# Patient Record
Sex: Male | Born: 1970 | Race: Asian | Hispanic: No | Marital: Single
Health system: Southern US, Community
[De-identification: ages and names within clinical notes are randomized; demographics above are authoritative.]

## PROBLEM LIST (undated history)

## (undated) DIAGNOSIS — K852 Alcohol induced acute pancreatitis without necrosis or infection: Secondary | ICD-10-CM

## (undated) DIAGNOSIS — Z720 Tobacco use: Secondary | ICD-10-CM

## (undated) DIAGNOSIS — H547 Unspecified visual loss: Secondary | ICD-10-CM

## (undated) HISTORY — DX: Alcohol induced acute pancreatitis without necrosis or infection: K85.20

## (undated) NOTE — Progress Notes (Signed)
 Formatting of this note is different from the original.  Subjective:    Patient ID: Cole Ward is a 46 y.o. male.  HPI The patient came in consult for evaluation of abnormal liver enzymes, abnormal CT scan of the abdomen and abdominal pain.  The interview was conducted with translator via phone.  The patient complains of pain in the epigastric area.  The pain is moderate in intensity and dull in character.  The pain appears to be triggered by meals.  He has been associated with some nausea and no vomiting.  He denies diarrhea or blood in the stool.  The patient has had a recent history of acute pancreatitis.  He feels however his pain has been subsiding and he is feeling better.  He was prescribed pantoprazole  which he is not taking.  He denies heartburn dysphagia or odynophagia.  He has had history of for ascites and abnormal CT scan suggestive for cirrhosis.  He has a recent imaging study that showed inflammatory changes in pancreas which appears to be improving without evidence for necrosis.  The patient is an alcoholic.  He stated he used to drink 1 L of either whiskey or brandy daily for longer than 10 years.  He presumably quit drinking alcohol 2 to 3 months ago.  He is a daily smoker.  He denies IV drug use.  He has had previous blood testing showing severe liver fibrosis by serology panel.  There is no documented history for upper endoscopy or colonoscopy.  He stated he is eating better lately.  He has not had any history for changes in mental status or jaundice as per his statement. Review of Systems  Constitutional: Negative for appetite change, chills, fatigue, fever and unexpected weight change.  HENT: Negative for dental problem, mouth sores, nosebleeds, sore throat, trouble swallowing and voice change.   Eyes: Negative for redness, itching and visual disturbance.  Respiratory: Negative for cough and shortness of breath.   Cardiovascular: Negative for chest pain, palpitations and leg swelling.   Gastrointestinal: Positive for abdominal pain. Negative for abdominal distention, blood in stool, constipation, diarrhea, nausea and rectal pain.  Endocrine: Negative for polydipsia and polyphagia.  Genitourinary: Negative for dysuria, flank pain, genital sores and hematuria.  Musculoskeletal: Negative for arthralgias, back pain, gait problem and joint swelling.  Skin: Negative for pallor and rash.  Allergic/Immunologic: Negative for environmental allergies, food allergies and immunocompromised state.  Neurological: Negative for seizures, syncope and headaches.  Hematological: Negative for adenopathy. Does not bruise/bleed easily.  Psychiatric/Behavioral: Negative for decreased concentration, dysphoric mood and sleep disturbance. The patient is not nervous/anxious.     Objective:   Vitals:   12/05/20 1516  BP: 104/75  BP Location: Left arm  Patient Position: Sitting  Cuff Size: Adult Regular  Pulse: 118  Resp: 16  Temp: 98.5 F (36.9 C)  TempSrc: Tympanic  Weight: 54.9 kg (121 lb)  Height: 5' 3 (1.6 m)   Body mass index is 21.43 kg/m. Body surface area is 1.56 meters squared.  The following Quality Measures are up to date for Lathyn Cockrell: Clinical Depression Screening was performed and patient is negative for depression. Future Fall Risk Screening: patient is NEGATIVE for future fall risk    Physical Exam Vitals reviewed.  Constitutional:      General: He is not in acute distress.    Appearance: He is well-developed and well-nourished. He is not ill-appearing, toxic-appearing or diaphoretic.  HENT:     Head: Normocephalic and atraumatic.  Mouth/Throat:     Mouth: Oropharynx is clear and moist.     Pharynx: No oropharyngeal exudate.     Comments: Poor oral hygiene Eyes:     General: No scleral icterus.       Right eye: No discharge.        Left eye: No discharge.     Conjunctiva/sclera: Conjunctivae normal.     Pupils: Pupils are equal, round, and reactive to  light.  Neck:     Thyroid: No thyromegaly.     Vascular: No JVD.     Trachea: No tracheal deviation.  Cardiovascular:     Rate and Rhythm: Normal rate and regular rhythm.     Heart sounds: Normal heart sounds. No murmur heard.   No friction rub. No gallop.  Pulmonary:     Effort: Pulmonary effort is normal. No respiratory distress.     Breath sounds: Normal breath sounds. No stridor. No wheezing or rales.  Abdominal:     General: Bowel sounds are normal. There is no distension.     Palpations: Abdomen is soft. There is no mass.     Tenderness: There is abdominal tenderness. There is no guarding or rebound.     Comments: No hernias.  Tender abdomen to palpation right upper quadrant area.  No ascites.  No superficial collateral vein or ecchymosis. No bruits   Musculoskeletal:        General: No tenderness, deformity or edema. Normal range of motion.     Cervical back: Neck supple.  Lymphadenopathy:     Cervical: No cervical adenopathy.     Right cervical: No superficial cervical adenopathy.    Left cervical: No superficial cervical adenopathy.     Upper Body:     Right upper body: No supraclavicular adenopathy.     Left upper body: No supraclavicular adenopathy.  Skin:    Coloration: Skin is not jaundiced.     Findings: No erythema or rash.     Comments: No palmar erythema or spider angiomas  Neurological:     General: No focal deficit present.     Mental Status: He is alert and oriented to person, place, and time.     Cranial Nerves: No cranial nerve deficit.     Motor: No abnormal muscle tone.     Coordination: Coordination normal.     Gait: Gait normal.     Deep Tendon Reflexes: Reflexes are normal and symmetric.     Comments: No tremors or asterixis  Psychiatric:        Mood and Affect: Mood and affect and mood normal.        Behavior: Behavior normal. Behavior is cooperative.        Judgment: Judgment normal.     Comments: No overt encephalopathy    Assessment/Plan:      Patient with history of alcoholism.  History of ascites and pancreatitis likely related to alcohol use disorder.  He has had epigastric pain and fullness postprandially.  He denies any significant diarrhea.  He has not had any major weight loss.  Imaging study failed to show any enlarging cystic lesion or worsening pancreatic inflammatory changes.  He has laboratory testing supporting diagnosis of a portal hypertension and severe liver fibrosis.  His physical exam failed to show stigmata associated with chronic liver disease.  He has had ascites however.  He appears not to have history for GI bleed or encephalopathy.  The patient abdominal pain possibly is related to his inflammatory process  in pancreas.  We must evaluate for inflammatory process in upper GI tract and eventually for any possible outlet obstruction.  We advised for a low in fat diet.  He was instructed to avoid alcohol products completely.  I advised and counseling in this regard.  I educated him in this regard and informed that bad outcome including death could be consequence of her reinitiating alcohol intake.  He must keep a high-protein type of diet and low-salt diet as well.  I will obtain additional blood testing to ruled out competing etiologies for elevated liver enzymes.  I advised and counseled for discontinuation of tobacco products.  We discussed performing upper endoscopy for further evaluation of his pain and to assess for possible esophageal varices.  He is agreeable with test.  He may benefit from colonoscopy for colorectal cancer screening testing in the near future.  All the information has been transmitted through translator and the patient appears to understand properly.  Further recommendation and instruction has been provided in written to the patient.  He is to continue his pantoprazole  daily and we sent prescription for it.The procedure, EGD was explained in detail to the patient. The limitation of the procedure in  establishing diagnosis was reviewed. The risk of EGD including  bleeding,perforation, aspiration pneumonia and those related to sedatives were discussed in detail. All the question regarding the procedure were answered. Problem List Items Addressed This Visit     Gastroenterology Problems   Alcoholic cirrhosis of liver (HCC)   Relevant Medications   pantoprazole  (PROTONIX ) 40 MG tablet   Other Relevant Orders   Alpha-1-antitrypsin   Mitochondrial antibodies, M2   Antinuclear AB, Hep-2 Substrate   Smooth Muscle AB w/Reflex Titer   Iron Panel (Iron,TIBC,%Sat)   Alpha Fetoprotein   Miscellaneous Lab Test:   EGD  Other Visit Diagnoses    Abnormal CT of the abdomen    -  Primary   Relevant Medications   pantoprazole  (PROTONIX ) 40 MG tablet   Other Relevant Orders   Alpha-1-antitrypsin   Mitochondrial antibodies, M2   Antinuclear AB, Hep-2 Substrate   Smooth Muscle AB w/Reflex Titer   Iron Panel (Iron,TIBC,%Sat)   Alpha Fetoprotein   Miscellaneous Lab Test:   EGD   Other ascites       Abdominal pain, generalized       Relevant Medications   pantoprazole  (PROTONIX ) 40 MG tablet   Other Relevant Orders   Alpha-1-antitrypsin   Mitochondrial antibodies, M2   Antinuclear AB, Hep-2 Substrate   Smooth Muscle AB w/Reflex Titer   Iron Panel (Iron,TIBC,%Sat)   Alpha Fetoprotein   Miscellaneous Lab Test:   EGD   Alcohol-induced chronic pancreatitis (HCC)       Relevant Medications   pantoprazole  (PROTONIX ) 40 MG tablet   Other Relevant Orders   Alpha-1-antitrypsin   Mitochondrial antibodies, M2   Antinuclear AB, Hep-2 Substrate   Smooth Muscle AB w/Reflex Titer   Iron Panel (Iron,TIBC,%Sat)   Alpha Fetoprotein   Miscellaneous Lab Test:   EGD      Steffan Chapman, MD Electronically signed by Chapman Steffan, MD at 12/05/2020  6:22 PM EDT

---

## 2014-07-05 ENCOUNTER — Emergency Department (HOSPITAL_COMMUNITY): Payer: Self-pay

## 2014-07-05 ENCOUNTER — Inpatient Hospital Stay (HOSPITAL_COMMUNITY)
Admission: EM | Admit: 2014-07-05 | Discharge: 2014-07-07 | DRG: 391 | Disposition: A | Discharge status: Home / Self Care | Payer: Self-pay | Attending: Internal Medicine | Admitting: Internal Medicine

## 2014-07-05 ENCOUNTER — Encounter (HOSPITAL_COMMUNITY): Payer: Self-pay | Admitting: *Deleted

## 2014-07-05 DIAGNOSIS — K759 Inflammatory liver disease, unspecified: Secondary | ICD-10-CM | POA: Diagnosis present

## 2014-07-05 DIAGNOSIS — K859 Acute pancreatitis without necrosis or infection, unspecified: Secondary | ICD-10-CM | POA: Diagnosis present

## 2014-07-05 DIAGNOSIS — R748 Abnormal levels of other serum enzymes: Secondary | ICD-10-CM

## 2014-07-05 DIAGNOSIS — F17210 Nicotine dependence, cigarettes, uncomplicated: Secondary | ICD-10-CM | POA: Diagnosis present

## 2014-07-05 DIAGNOSIS — R7401 Elevation of levels of liver transaminase levels: Secondary | ICD-10-CM | POA: Diagnosis present

## 2014-07-05 DIAGNOSIS — F1010 Alcohol abuse, uncomplicated: Secondary | ICD-10-CM | POA: Diagnosis present

## 2014-07-05 DIAGNOSIS — H547 Unspecified visual loss: Secondary | ICD-10-CM | POA: Diagnosis present

## 2014-07-05 DIAGNOSIS — R109 Unspecified abdominal pain: Secondary | ICD-10-CM | POA: Diagnosis present

## 2014-07-05 DIAGNOSIS — K219 Gastro-esophageal reflux disease without esophagitis: Secondary | ICD-10-CM | POA: Diagnosis present

## 2014-07-05 DIAGNOSIS — Z21 Asymptomatic human immunodeficiency virus [HIV] infection status: Secondary | ICD-10-CM | POA: Diagnosis present

## 2014-07-05 DIAGNOSIS — Z720 Tobacco use: Secondary | ICD-10-CM | POA: Diagnosis present

## 2014-07-05 DIAGNOSIS — K2920 Alcoholic gastritis without bleeding: Principal | ICD-10-CM | POA: Diagnosis present

## 2014-07-05 DIAGNOSIS — R740 Nonspecific elevation of levels of transaminase and lactic acid dehydrogenase [LDH]: Secondary | ICD-10-CM

## 2014-07-05 DIAGNOSIS — K922 Gastrointestinal hemorrhage, unspecified: Secondary | ICD-10-CM | POA: Diagnosis present

## 2014-07-05 HISTORY — DX: Unspecified visual loss: H54.7

## 2014-07-05 HISTORY — DX: Tobacco use: Z72.0

## 2014-07-05 LAB — CBC WITH DIFFERENTIAL/PLATELET
Basophils Absolute: 0.1 10*3/uL (ref 0.0–0.1)
Basophils Relative: 1 % (ref 0–1)
Eosinophils Absolute: 0.3 10*3/uL (ref 0.0–0.7)
Eosinophils Relative: 4 % (ref 0–5)
HCT: 38.4 % — ABNORMAL LOW (ref 39.0–52.0)
Hemoglobin: 13.4 g/dL (ref 13.0–17.0)
LYMPHS PCT: 34 % (ref 12–46)
Lymphs Abs: 2.3 10*3/uL (ref 0.7–4.0)
MCH: 30.7 pg (ref 26.0–34.0)
MCHC: 34.9 g/dL (ref 30.0–36.0)
MCV: 88.1 fL (ref 78.0–100.0)
Monocytes Absolute: 0.3 10*3/uL (ref 0.1–1.0)
Monocytes Relative: 4 % (ref 3–12)
NEUTROS ABS: 3.9 10*3/uL (ref 1.7–7.7)
NEUTROS PCT: 57 % (ref 43–77)
PLATELETS: 101 10*3/uL — AB (ref 150–400)
RBC: 4.36 MIL/uL (ref 4.22–5.81)
RDW: 14.8 % (ref 11.5–15.5)
WBC: 6.9 10*3/uL (ref 4.0–10.5)

## 2014-07-05 LAB — URINALYSIS, ROUTINE W REFLEX MICROSCOPIC
BILIRUBIN URINE: NEGATIVE
GLUCOSE, UA: NEGATIVE mg/dL
Hgb urine dipstick: NEGATIVE
Ketones, ur: NEGATIVE mg/dL
Leukocytes, UA: NEGATIVE
Nitrite: NEGATIVE
PH: 6 (ref 5.0–8.0)
Protein, ur: NEGATIVE mg/dL
SPECIFIC GRAVITY, URINE: 1.007 (ref 1.005–1.030)
Urobilinogen, UA: 1 mg/dL (ref 0.0–1.0)

## 2014-07-05 LAB — COMPREHENSIVE METABOLIC PANEL
ALBUMIN: 2.9 g/dL — AB (ref 3.5–5.0)
ALT: 81 U/L — ABNORMAL HIGH (ref 17–63)
ANION GAP: 11 (ref 5–15)
AST: 505 U/L — ABNORMAL HIGH (ref 15–41)
Alkaline Phosphatase: 246 U/L — ABNORMAL HIGH (ref 38–126)
BUN: 10 mg/dL (ref 6–20)
CO2: 24 mmol/L (ref 22–32)
CREATININE: 0.96 mg/dL (ref 0.61–1.24)
Calcium: 8.5 mg/dL — ABNORMAL LOW (ref 8.9–10.3)
Chloride: 95 mmol/L — ABNORMAL LOW (ref 101–111)
GFR calc Af Amer: >60 mL/min (ref >60)
GFR calc non Af Amer: >60 mL/min (ref >60)
Glucose, Bld: 150 mg/dL — ABNORMAL HIGH (ref 65–99)
POTASSIUM: 3.9 mmol/L (ref 3.5–5.1)
SODIUM: 130 mmol/L — AB (ref 135–145)
Total Bilirubin: 3.7 mg/dL — ABNORMAL HIGH (ref 0.3–1.2)
Total Protein: 6.3 g/dL — ABNORMAL LOW (ref 6.5–8.1)

## 2014-07-05 LAB — LIPASE, BLOOD: Lipase: 58 U/L — ABNORMAL HIGH (ref 22–51)

## 2014-07-05 LAB — I-STAT CG4 LACTIC ACID, ED: Lactic Acid, Venous: 1.13 mmol/L (ref 0.5–2.0)

## 2014-07-05 MED ORDER — SODIUM CHLORIDE 0.9 % IV BOLUS (SEPSIS)
1000.0000 mL | INTRAVENOUS | Status: AC
  Administered 2014-07-05: 1000 mL via INTRAVENOUS

## 2014-07-05 MED ORDER — MORPHINE SULFATE 4 MG/ML IJ SOLN
4.0000 mg | Freq: Once | INTRAMUSCULAR | Status: AC
  Administered 2014-07-05: 4 mg via INTRAVENOUS
  Filled 2014-07-05: qty 1

## 2014-07-05 MED ORDER — ONDANSETRON HCL 4 MG/2ML IJ SOLN
4.0000 mg | INTRAMUSCULAR | Status: AC
  Administered 2014-07-05: 4 mg via INTRAVENOUS
  Filled 2014-07-05: qty 2

## 2014-07-05 NOTE — ED Provider Notes (Signed)
CSN: [id number]     Arrival date & time 07/05/14  1802 History   First MD Initiated Contact with Patient 07/05/14 2119     Chief Complaint  Patient presents with  . Abdominal Pain   (Consider location/radiation/quality/duration/timing/severity/associated sxs/prior Treatment) The history is provided by the patient. A language interpreter was used.     Cole Ward is a 44 present with right upper quadrant abdominal pain. He states the pain began intermittently, 2 weeks ago, but became more constant over the last 2-3 days. He also states over the last 2-3 days he has vomited after every meal. He vomited 3-4 times daily over last 2-3 days. His last meal was this morning and he subsequently vomited. He reports feeling weak due to not eating. He currently rates his pain as a 4/10. He states after eating the pain goes up to a 10/10 until he vomits and that improves the pain.  He denies any significant medical history. He denies any surgeries. He denies any alcohol intake. He denies fevers, chills, diarrhea or bloody emesis.  Past Medical History  Diagnosis Date  . Vision decreased    History reviewed. No pertinent past surgical history. No family history on file. History  Substance Use Topics  . Smoking status: Current Every Day Smoker -- 0.50 packs/day    Types: Cigarettes  . Smokeless tobacco: Not on file  . Alcohol Use: 4.2 oz/week    7 Cans of beer per week    Review of Systems  Constitutional: Negative for fever and chills.  HENT: Negative for sore throat.   Eyes: Negative for visual disturbance.  Respiratory: Negative for cough and shortness of breath.   Cardiovascular: Negative for chest pain and leg swelling.  Gastrointestinal: Positive for nausea, vomiting and abdominal pain. Negative for diarrhea.  Genitourinary: Negative for dysuria.  Musculoskeletal: Negative for myalgias.  Skin: Negative for rash.  Neurological: Negative for weakness, numbness and headaches.       Allergies  Review of patient's allergies indicates no known allergies.  Home Medications   Prior to Admission medications   Not on File   BP 115/90 mmHg  Pulse 78  Temp(Src) 98.6 F (37 C) (Oral)  Resp 16  Wt 121 lb (54.885 kg)  SpO2 97% Physical Exam  Constitutional: He appears well-developed and well-nourished. No distress.  HENT:  Head: Normocephalic and atraumatic.  Mouth/Throat: Oropharynx is clear and moist. No oropharyngeal exudate.  Eyes: Scleral icterus is present.  Neck: Normal range of motion.  Cardiovascular: Normal rate, regular rhythm and intact distal pulses.   Pulmonary/Chest: Effort normal and breath sounds normal. No respiratory distress. He has no wheezes. He has no rales. He exhibits no tenderness.  Abdominal: Soft. He exhibits no distension and no mass. There is no hepatosplenomegaly, splenomegaly or hepatomegaly. There is tenderness in the right upper quadrant. There is positive Murphy's sign. There is no rigidity, no rebound, no guarding, no CVA tenderness and no tenderness at McBurney's point.  Musculoskeletal: He exhibits no tenderness.  Lymphadenopathy:    He has no cervical adenopathy.  Neurological: He is alert.  Skin: Skin is warm and dry. No rash noted. He is not diaphoretic.  Psychiatric: He has a normal mood and affect.  Nursing note and vitals reviewed.   ED Course  Procedures (including critical care time) Labs Review Labs Reviewed  CBC WITH DIFFERENTIAL/PLATELET - Abnormal; Notable for the following:    HCT 38.4 (*)    Platelets 101 (*)    All other  components within normal limits  COMPREHENSIVE METABOLIC PANEL - Abnormal; Notable for the following:    Sodium 130 (*)    Chloride 95 (*)    Glucose, Bld 150 (*)    Calcium 8.5 (*)    Total Protein 6.3 (*)    Albumin 2.9 (*)    AST 505 (*)    ALT 81 (*)    Alkaline Phosphatase 246 (*)    Total Bilirubin 3.7 (*)    All other components within normal limits  LIPASE, BLOOD -  Abnormal; Notable for the following:    Lipase 58 (*)    All other components within normal limits  URINALYSIS, ROUTINE W REFLEX MICROSCOPIC - Abnormal; Notable for the following:    Color, Urine AMBER (*)    All other components within normal limits  HEPATITIS PANEL, ACUTE  I-STAT CG4 LACTIC ACID, ED  I-STAT CG4 LACTIC ACID, ED    Imaging Review US Abdomen Limited  07/06/2014   CLINICAL DATA:  Right upper quadrant abdominal pain.  EXAM: US ABDOMEN LIMITED - RIGHT UPPER QUADRANT  COMPARISON:  None.  FINDINGS: Gallbladder:  Physiologically distended. No gallstones or wall thickening visualized. No sonographic Murphy sign noted. Probable Phrygian cap.  Common bile duct:  Diameter: 6.1 mm.  Liver:  No focal lesion identified. Diffusely increased and patchy parenchymal echogenicity. Normal directional flow in the main portal vein.  IMPRESSION: 1. Borderline prominence of the common bile duct of 6 mm. No gallbladder distention. Significance is uncertain, this may be normal for this patient. 2. Increased hepatic echogenicity most consistent with steatosis.   Electronically Signed   By: Jeb Levering M.D.   On: 07/06/2014 00:44     EKG Interpretation   Date/Time:  Thursday Jul 05 2014 18:22:33 EDT Ventricular Rate:  78 PR Interval:  152 QRS Duration: 84 QT Interval:  364 QTC Calculation: 414 R Axis:   60 Text Interpretation:  Normal sinus rhythm Normal ECG No significant change  since last tracing Confirmed by Maryan Rued  MD, Loree Fee ([id number]) on 07/05/2014  10:23:34 PM      MDM   Final diagnoses:  Elevated liver enzymes   44 yo RUQ abd pain and scleral icterus, positive Murphy's sign, consider biliary obstruction. NS bolus, pain and nausea meds, CBC, CMP, Lipase, RUQ [place]. Discussed case with Dr. Maryan Rued.  Labs reviewed: NA: 130, AST: 505, ALT: 81, Alk Phos: 246, Total Bili: 3.7, Lipase 58. Hepatitis panel and lactic acid ordered.   1:30 AM: [place] reviewed showing borderline prominence  of the CBD but no definitive stones or obstruction. Consulted GI who recommends medical admission to trend liver enzymes. Discussed results and plan with family.  2:15 AM: Consulted Dr. Blaine Hamper (hospitalist), regarding pt's case and GI recommendations to admit for serial liver enzymes. Pt admitted to med/surg bed. The patient appears reasonably stabilized for admission considering the current resources, flow, and capabilities available in the ED at this time, and I doubt any further screening and/or treatment in the ED prior to admission.   Filed Vitals:   07/05/14 2230 07/05/14 2245 07/05/14 2300 07/06/14 0045  BP: 120/83 114/79 124/80 116/83  Pulse: 73 70 69 66  Temp:      TempSrc:      Resp:      Weight:      SpO2: 100% 99% 97% 96%   Meds given in ED:  Medications  sodium chloride 0.9 % bolus 1,000 mL (0 mLs Intravenous Stopped 07/06/14 0054)  morphine 4 MG/ML injection  4 mg (4 mg Intravenous Given 07/05/14 2156)  ondansetron (ZOFRAN) injection 4 mg (4 mg Intravenous Given 07/05/14 2156)    New Prescriptions   No medications on file      Britt Bottom, NP 07/06/14 [place], MD 07/09/14 [phone]

## 2014-07-05 NOTE — ED Notes (Signed)
Pt c/o RUQ pain x 2 weeks.  Denies nausea, diarrhea or constipation.  Came in today b/c the pain is too great.

## 2014-07-06 ENCOUNTER — Encounter (HOSPITAL_COMMUNITY): Payer: Self-pay | Admitting: Internal Medicine

## 2014-07-06 ENCOUNTER — Encounter (HOSPITAL_COMMUNITY)
Admission: EM | Disposition: A | Discharge status: Home / Self Care | Payer: Self-pay | Source: Home / Self Care | Attending: Internal Medicine

## 2014-07-06 ENCOUNTER — Inpatient Hospital Stay (HOSPITAL_COMMUNITY): Payer: Self-pay

## 2014-07-06 DIAGNOSIS — R740 Nonspecific elevation of levels of transaminase and lactic acid dehydrogenase [LDH]: Secondary | ICD-10-CM

## 2014-07-06 DIAGNOSIS — F1010 Alcohol abuse, uncomplicated: Secondary | ICD-10-CM | POA: Diagnosis present

## 2014-07-06 DIAGNOSIS — R748 Abnormal levels of other serum enzymes: Secondary | ICD-10-CM

## 2014-07-06 DIAGNOSIS — R7401 Elevation of levels of liver transaminase levels: Secondary | ICD-10-CM | POA: Diagnosis present

## 2014-07-06 DIAGNOSIS — R109 Unspecified abdominal pain: Secondary | ICD-10-CM | POA: Diagnosis present

## 2014-07-06 DIAGNOSIS — K921 Melena: Secondary | ICD-10-CM

## 2014-07-06 DIAGNOSIS — R1011 Right upper quadrant pain: Secondary | ICD-10-CM

## 2014-07-06 DIAGNOSIS — K859 Acute pancreatitis without necrosis or infection, unspecified: Secondary | ICD-10-CM | POA: Diagnosis present

## 2014-07-06 DIAGNOSIS — K922 Gastrointestinal hemorrhage, unspecified: Secondary | ICD-10-CM | POA: Diagnosis present

## 2014-07-06 DIAGNOSIS — Z720 Tobacco use: Secondary | ICD-10-CM | POA: Diagnosis present

## 2014-07-06 DIAGNOSIS — R1013 Epigastric pain: Secondary | ICD-10-CM

## 2014-07-06 LAB — RAPID URINE DRUG SCREEN, HOSP PERFORMED
AMPHETAMINES: NOT DETECTED
BARBITURATES: NOT DETECTED
BENZODIAZEPINES: NOT DETECTED
Cocaine: NOT DETECTED
OPIATES: POSITIVE — AB
Tetrahydrocannabinol: NOT DETECTED

## 2014-07-06 LAB — HIV ANTIBODY (ROUTINE TESTING W REFLEX): HIV SCREEN 4TH GENERATION: NONREACTIVE

## 2014-07-06 LAB — COMPREHENSIVE METABOLIC PANEL
ALT: 64 U/L — AB (ref 17–63)
AST: 342 U/L — AB (ref 15–41)
Albumin: 2.4 g/dL — ABNORMAL LOW (ref 3.5–5.0)
Alkaline Phosphatase: 192 U/L — ABNORMAL HIGH (ref 38–126)
Anion gap: 8 (ref 5–15)
BUN: <5 mg/dL — ABNORMAL LOW (ref 6–20)
CO2: 26 mmol/L (ref 22–32)
Calcium: 8.4 mg/dL — ABNORMAL LOW (ref 8.9–10.3)
Chloride: 102 mmol/L (ref 101–111)
Creatinine, Ser: 0.78 mg/dL (ref 0.61–1.24)
GFR calc Af Amer: >60 mL/min (ref >60)
GFR calc non Af Amer: >60 mL/min (ref >60)
GLUCOSE: 97 mg/dL (ref 65–99)
Potassium: 3.4 mmol/L — ABNORMAL LOW (ref 3.5–5.1)
SODIUM: 136 mmol/L (ref 135–145)
TOTAL PROTEIN: 5.8 g/dL — AB (ref 6.5–8.1)
Total Bilirubin: 5.4 mg/dL — ABNORMAL HIGH (ref 0.3–1.2)

## 2014-07-06 LAB — GLUCOSE, CAPILLARY
Glucose-Capillary: 108 mg/dL — ABNORMAL HIGH (ref 65–99)
Glucose-Capillary: 107 mg/dL — ABNORMAL HIGH (ref 65–99)

## 2014-07-06 LAB — ETHANOL

## 2014-07-06 LAB — APTT: aPTT: 57 seconds — ABNORMAL HIGH (ref 24–37)

## 2014-07-06 LAB — I-STAT CG4 LACTIC ACID, ED: Lactic Acid, Venous: 0.59 mmol/L (ref 0.5–2.0)

## 2014-07-06 LAB — PROTIME-INR
INR: 1.23 (ref 0.00–1.49)
Prothrombin Time: 15.7 seconds — ABNORMAL HIGH (ref 11.6–15.2)

## 2014-07-06 SURGERY — EGD (ESOPHAGOGASTRODUODENOSCOPY)
Anesthesia: Moderate Sedation

## 2014-07-06 MED ORDER — LORAZEPAM 1 MG PO TABS: 1.0000 mg | ORAL_TABLET | Freq: Four times a day (QID) | ORAL | Status: DC | PRN

## 2014-07-06 MED ORDER — IOHEXOL 300 MG/ML  SOLN
25.0000 mL | INTRAMUSCULAR | Status: AC
  Administered 2014-07-06: 25 mL via ORAL

## 2014-07-06 MED ORDER — ADULT MULTIVITAMIN W/MINERALS CH
1.0000 | ORAL_TABLET | Freq: Every day | ORAL | Status: DC
  Administered 2014-07-06 – 2014-07-07 (×2): 1 via ORAL
  Filled 2014-07-06 (×2): qty 1

## 2014-07-06 MED ORDER — SODIUM CHLORIDE 0.9 % IV SOLN: INTRAVENOUS | Status: DC

## 2014-07-06 MED ORDER — HEPARIN SODIUM (PORCINE) 5000 UNIT/ML IJ SOLN
5000.0000 [IU] | Freq: Three times a day (TID) | INTRAMUSCULAR | Status: DC
Start: 2014-07-06 — End: 2014-07-06
  Administered 2014-07-06: 5000 [IU] via SUBCUTANEOUS
  Filled 2014-07-06: qty 1

## 2014-07-06 MED ORDER — PANTOPRAZOLE SODIUM 40 MG IV SOLR
40.0000 mg | Freq: Two times a day (BID) | INTRAVENOUS | Status: DC
  Administered 2014-07-06 – 2014-07-07 (×3): 40 mg via INTRAVENOUS
  Filled 2014-07-06 (×4): qty 40

## 2014-07-06 MED ORDER — ONDANSETRON HCL 4 MG/2ML IJ SOLN: 4.0000 mg | Freq: Three times a day (TID) | INTRAMUSCULAR | Status: DC | PRN

## 2014-07-06 MED ORDER — SODIUM CHLORIDE 0.9 % IV SOLN
INTRAVENOUS | Status: DC
  Administered 2014-07-07: via INTRAVENOUS

## 2014-07-06 MED ORDER — SODIUM CHLORIDE 0.9 % IV SOLN
INTRAVENOUS | Status: DC
  Administered 2014-07-06: 06:00:00 via INTRAVENOUS

## 2014-07-06 MED ORDER — ALUM & MAG HYDROXIDE-SIMETH 200-200-20 MG/5ML PO SUSP: 30.0000 mL | Freq: Four times a day (QID) | ORAL | Status: DC | PRN

## 2014-07-06 MED ORDER — FOLIC ACID 1 MG PO TABS
1.0000 mg | ORAL_TABLET | Freq: Every day | ORAL | Status: DC
  Administered 2014-07-06 – 2014-07-07 (×2): 1 mg via ORAL
  Filled 2014-07-06 (×2): qty 1

## 2014-07-06 MED ORDER — IOHEXOL 300 MG/ML  SOLN
80.0000 mL | Freq: Once | INTRAMUSCULAR | Status: AC | PRN
  Administered 2014-07-06: 100 mL via INTRAVENOUS

## 2014-07-06 MED ORDER — LORAZEPAM 2 MG/ML IJ SOLN: 1.0000 mg | Freq: Four times a day (QID) | INTRAMUSCULAR | Status: DC | PRN

## 2014-07-06 MED ORDER — NICOTINE 21 MG/24HR TD PT24
21.0000 mg | MEDICATED_PATCH | Freq: Every day | TRANSDERMAL | Status: DC
  Administered 2014-07-06 – 2014-07-07 (×2): 21 mg via TRANSDERMAL
  Filled 2014-07-06 (×2): qty 1

## 2014-07-06 MED ORDER — POTASSIUM CHLORIDE CRYS ER 20 MEQ PO TBCR
40.0000 meq | EXTENDED_RELEASE_TABLET | Freq: Once | ORAL | Status: AC
  Administered 2014-07-06: 40 meq via ORAL
  Filled 2014-07-06: qty 2

## 2014-07-06 MED ORDER — VITAMIN B-1 100 MG PO TABS
100.0000 mg | ORAL_TABLET | Freq: Every day | ORAL | Status: DC
  Administered 2014-07-07: 100 mg via ORAL
  Filled 2014-07-06 (×2): qty 1

## 2014-07-06 MED ORDER — THIAMINE HCL 100 MG/ML IJ SOLN
100.0000 mg | Freq: Every day | INTRAMUSCULAR | Status: DC
  Administered 2014-07-06: 100 mg via INTRAVENOUS
  Filled 2014-07-06 (×2): qty 1

## 2014-07-06 MED ORDER — MORPHINE SULFATE 4 MG/ML IJ SOLN
2.0000 mg | INTRAMUSCULAR | Status: DC | PRN
  Administered 2014-07-06: 2 mg via INTRAVENOUS
  Filled 2014-07-06: qty 1

## 2014-07-06 NOTE — Progress Notes (Signed)
[facility]                                                                              Patient Demographics  Cole Ward, is a 44 y.o. male, DOB - 05/14/1970, [id number]:[id number]  Admit date - 07/05/2014   Admitting Physician Lorretta HarpXilin Niu, MD  Outpatient Primary MD for the patient is No PCP Per Patient  LOS - 0   Chief Complaint  Patient presents with  . Abdominal Pain       Brief HPI  Patient is a 44 year old male with tobacco abuse, alcohol abuse, presented with abdominal pain, speaks Burmese. History was obtained via translator. Patient reported that he has been having abdominal pain in the past 2 weeks. It is intermittent, happens almost every day. It has been worsening in the past 2 or 3 days. It is located in the right upper quadrant, moderate, nonradiating. It is associated with nausea and vomiting. He vomits almost every times when eating food in the past 2 days. He does not have diarrhea. No symptoms of UTI. He has mild cough due to smoking, which has not changed. No chest pain. Patient reports that he smokes 4-5 cigarettes per day in the past 30 years. He drinks beers very little.  Currently patient denies fever, chills, running nose, ear pain, headaches, chest pain, SOB, constipation, dysuria, urgency, frequency, hematuria, skin rashes, joint pain or leg swelling. No unilateral weakness, numbness or tingling sensations. No vision change or hearing loss.  In ED, patient was found to have abnormal liver function with AST 505, ALP 81, total bilirubin 3.7, ALP 246. Abdominal ultrasound showed borderline prominence of the common bile duct of 6 mm. No gallbladder distention, has increased hepatic echogenicity most consistent with steatosis. WBC 6.9, lipase 58, negative urinalysis, lactate 1.13. temperature normal, no tachycardia, electrolytes okay. Patient is admitted to inpatient for further evaluation and treatment.    Assessment & Plan    Principal Problem:   Abdominal  pain, epigastric with GI bleed, melanotic stools for 3 weeks:  -  discussed with gastroenterology, plan on EGD today  - CT abdomen and pelvis pending  - Continue PPI, trend LFTs   Active Problems:  transaminitis with pancreatitis  - Continue to trend LFTs, hepatitis panel pending, ultrasound of the abdomen showed borderline CBD 6 mm, no gallbladder distention, steatosis  - Acute hepatitis panel pending, LFTs trending down however bilirubin up - GI following Continue nothing by mouth status with IV fluid hydration-      Tobacco abuse - Counseled on smoking cessation     Alcohol abuse - Patient drinks 3-5 beers daily for many years, placed on  CIWA protocol with ativan  Code Status: full code  Family Communication: Discussed in detail with the patient, all imaging results, lab results explained to the patient    Disposition Plan:  pending endoscopy and GI evaluation   Time Spent in minutes 25 minutes  Procedures  US abd  Consults   GI  DVT Prophylaxis  SCD's. Heparin subcutaneous discontinued  Medications  Scheduled Meds: . folic acid  1 mg Oral Daily  . iohexol  25 mL Oral  Q1 Hr x 2  . multivitamin with minerals  1 tablet Oral Daily  . nicotine  21 mg Transdermal Daily  . pantoprazole (PROTONIX) IV  40 mg Intravenous Q12H  . potassium chloride  40 mEq Oral Once  . thiamine  100 mg Oral Daily   Or  . thiamine  100 mg Intravenous Daily   Continuous Infusions: . sodium chloride 125 mL/hr at 07/06/14 0533  . sodium chloride     PRN Meds:.alum & mag hydroxide-simeth, LORazepam **OR** LORazepam, morphine, ondansetron (ZOFRAN) IV   Antibiotics   Anti-infectives    None        Subjective:   Danis Brodrick was seen and examined today.   still has epigastric abdominal pain, no ongoing GI bleeding. No nausea or vomiting Patient denies dizziness, chest pain, shortness of breath, N/V/D/C, new weakness, numbess, tingling. No acute events overnight.    Objective:    Blood pressure 132/81, pulse 66, temperature 98.3 F (36.8 C), temperature source Oral, resp. rate 16, weight 54.885 kg (121 lb), SpO2 100 %.  Wt Readings from Last 3 Encounters:  07/05/14 54.885 kg (121 lb)     Intake/Output Summary (Last 24 hours) at 07/06/14 1255 Last data filed at 07/06/14 0938  Gross per 24 hour  Intake      0 ml  Output    400 ml  Net   -400 ml    Exam  General: Alert and oriented x 3, NAD  HEENT:  PERRLA, EOMI, mucous membranes moist.   Neck: Supple, no JVD, no masses  CVS: S1 S2 auscultated, no rubs, murmurs or gallops. Regular rate and rhythm.  Respiratory: Clear to auscultation bilaterally, no wheezing, rales or rhonchi  Abdomen: Soft, mild tenderness in the epigastric region and right upper quadrant , nondistended, + bowel sounds  Ext: no cyanosis clubbing or edema  Neuro: AAOx3, Cr N's II- XII. Strength 5/5 upper and lower extremities bilaterally  Skin: No rashes  Psych: Normal affect and demeanor, alert and oriented x3    Data Review   Micro Results No results found for this or any previous visit (from the past 240 hour(s)).  Radiology Reports [place] Abdomen Limited  07/06/2014   CLINICAL DATA:  Right upper quadrant abdominal pain.  EXAM: US ABDOMEN LIMITED - RIGHT UPPER QUADRANT  COMPARISON:  None.  FINDINGS: Gallbladder:  Physiologically distended. No gallstones or wall thickening visualized. No sonographic Murphy sign noted. Probable Phrygian cap.  Common bile duct:  Diameter: 6.1 mm.  Liver:  No focal lesion identified. Diffusely increased and patchy parenchymal echogenicity. Normal directional flow in the main portal vein.  IMPRESSION: 1. Borderline prominence of the common bile duct of 6 mm. No gallbladder distention. Significance is uncertain, this may be normal for this patient. 2. Increased hepatic echogenicity most consistent with steatosis.   Electronically Signed   By: Rubye OaksMelanie  Ehinger M.D.   On: 07/06/2014 00:44     CBC  Recent Labs Lab 07/05/14 1823  WBC 6.9  HGB 13.4  HCT 38.4*  PLT 101*  MCV 88.1  MCH 30.7  MCHC 34.9  RDW 14.8  LYMPHSABS 2.3  MONOABS 0.3  EOSABS 0.3  BASOSABS 0.1    Chemistries   Recent Labs Lab 07/05/14 1823 07/06/14 0630  NA 130* 136  K 3.9 3.4*  CL 95* 102  CO2 24 26  GLUCOSE 150* 97  BUN 10 <5*  CREATININE 0.96 0.78  CALCIUM 8.5* 8.4*  AST 505* 342*  ALT 81* 64*  ALKPHOS 246* 192*  BILITOT 3.7* 5.4*   ------------------------------------------------------------------------------------------------------------------ CrCl cannot be calculated (Unknown ideal weight.). ------------------------------------------------------------------------------------------------------------------ No results for input(s): HGBA1C in the last 72 hours. ------------------------------------------------------------------------------------------------------------------ No results for input(s): CHOL, HDL, LDLCALC, TRIG, CHOLHDL, LDLDIRECT in the last 72 hours. ------------------------------------------------------------------------------------------------------------------ No results for input(s): TSH, T4TOTAL, T3FREE, THYROIDAB in the last 72 hours.  Invalid input(s): FREET3 ------------------------------------------------------------------------------------------------------------------ No results for input(s): VITAMINB12, FOLATE, FERRITIN, TIBC, IRON, RETICCTPCT in the last 72 hours.  Coagulation profile  Recent Labs Lab 07/06/14 0630  INR 1.23    No results for input(s): DDIMER in the last 72 hours.  Cardiac Enzymes No results for input(s): CKMB, TROPONINI, MYOGLOBIN in the last 168 hours.  Invalid input(s): CK ------------------------------------------------------------------------------------------------------------------ Invalid input(s): POCBNP   Recent Labs  07/06/14 0548 07/06/14 0750  GLUCAP 108* 107*     Algie Westry M.D. [facility] 07/06/2014, 12:55 PM  Pager: [phone]   Between 7am to 7pm - call Pager - [phone]  After 7pm go to [email or web address] - password [email or web address]  Call night coverage person covering after 7pm

## 2014-07-06 NOTE — Progress Notes (Signed)
Utilized [employer] to talk with the patient during his assessment and to reinforce his NPO status at midnight for his EGD tomorrow.  The patient verbally stated that he agreed.

## 2014-07-06 NOTE — Progress Notes (Signed)
Received call from pts nurse. pts family brought him in a bowl of rice. EGD cancelled. Pt can eat AFTER CT scan. Reviewed with Dr Perry--pt will be put on for EGD tomorrow. 30-35 minutes spent on phone with pt and [employer] to again explain possible causes for abnormal LFTs, and possible causes of melena/heme positive stools. EGD explained to pt. Pt and his family have agreed  that pt will stay and have EGD. Pt also reported that he has had trouble with his stomach from alcohol years ago and had stopped drinking for a year, but then restarted drinking.   GI ATTENDING  As above  Wilhemina BonitoJohn N. Eda KeysPerry, Jr., M.D. [facility] Division of Gastroenterology

## 2014-07-06 NOTE — Progress Notes (Signed)
Utilization review completed.  

## 2014-07-06 NOTE — Progress Notes (Signed)
Using interpreter [phone] tried explaining to patient that if he would not eat until 4pm we could get the endo procedure done today. Patient still does not want to go through with it. According to interpreter he doesn't understand why and is afraid. Notified lori again. She is coming to talk with him.

## 2014-07-06 NOTE — ED Notes (Signed)
Family at bedside. 

## 2014-07-06 NOTE — Care Management Note (Signed)
Case Management Note  Patient Details  Name: Webb Lawsai Glassburn MRN: [id number] Date of Birth: 09/29/1970  Subjective/Objective:            NCM spoke with patient thru Burmese interpreter on the phone, patient does not have a pcp , [facility].  Also states if medications are not expensive he could afford them, but if they are expensive willl need med ast.  If patient is dc over the weekend the NCM will have to do the Match Program for patient if he can not afford meds, otherwise he could go to clinic Mon-Friday to get meds from their pharmacy.  NCM gave patient's son the  Brochure for the [facility] apt information.         Action/Plan:   Expected Discharge Date:                  Expected Discharge Plan:  Home/Self Care  In-House Referral:     Discharge planning Services  CM Consult, Indigent [facility] Clinic, Follow-up appt scheduled, Medication Assistance  Post Acute Care Choice:    Choice offered to:     DME Arranged:    DME Agency:     HH Arranged:    HH Agency:     Status of Service:  In process, will continue to follow  Medicare Important Message Given:  No Date Medicare IM Given:    Medicare IM give by:    Date Additional Medicare IM Given:    Additional Medicare Important Message give by:     If discussed at Long Length of Stay Meetings, dates discussed:    Additional Comments:  Leone Havenaylor, Mairead Schwarzkopf [place], RN 07/06/2014, 11:23 AM

## 2014-07-06 NOTE — H&P (Signed)
[facility] History and Physical  Cole LawsNai Larivee [id number] DOB: 12/29/1970 DOA: 07/05/2014  Referring physician: ED physician PCP: No PCP Per Patient  Specialists:   Chief Complaint: Abdominal pain, nausea, vomiting  HPI: Cole Ward is a 44 y.o. male with PMH of tobacco abuse, who presents with abdominal pain.  Patient does not speak [place]. He speaks Burmese. History was obtained via translator. Patient reports that he has been having abdominal pain in the past 2 weeks. It is intermittent, happens almost every day. It has been worsening in the past 2 or 3 days. It is located in the right upper quadrant, moderate, nonradiating. It is associated with nausea and vomiting. He vomits almost every times when eating food in the past 2 days. He does not have diarrhea. No symptoms of UTI. He has mild cough due to smoking, which has not changed. No chest pain. Patient reports that he smokes 4-5 cigarettes per day in the past 30 years. He drinks beers very little.  Currently patient denies fever, chills, running nose, ear pain, headaches, chest pain, SOB, constipation, dysuria, urgency, frequency, hematuria, skin rashes, joint pain or leg swelling. No unilateral weakness, numbness or tingling sensations. No vision change or hearing loss.  In ED, patient was found to have abnormal liver function with AST 505, ALP 81, total bilirubin 3.7, ALP 246. Abdominal ultrasound showed borderline prominence of the common bile duct of 6 mm. No gallbladder distention, has increased hepatic echogenicity most consistent with steatosis. WBC 6.9, lipase 58, negative urinalysis, lactate 1.13. temperature normal, no tachycardia, electrolytes okay. Patient is admitted to inpatient for further evaluation and treatment.  Where does patient live?   At home   Can patient participate in ADLs?  Yes     Review of Systems:   General: no fevers, chills, no changes in body weight, has poor appetite, has fatigue HEENT: no blurry  vision, hearing changes or sore throat Pulm: no dyspnea, coughing, wheezing CV: no chest pain, palpitations Abd: has nausea, vomiting, abdominal pain, No diarrhea, constipation GU: no dysuria, burning on urination, increased urinary frequency, hematuria  Ext: no leg edema Neuro: no unilateral weakness, numbness, or tingling, no vision change or hearing loss Skin: no rash MSK: No muscle spasm, no deformity, no limitation of range of movement in spin Heme: No easy bruising.  Travel history: No recent long distant travel.  Allergy: No Known Allergies  Past Medical History  Diagnosis Date  . Vision decreased   . Tobacco abuse     History reviewed. No pertinent past surgical history.  Social History:  reports that he has been smoking Cigarettes.  He has been smoking about 0.50 packs per day. He does not have any smokeless tobacco history on file. He reports that he drinks about 4.2 oz of alcohol per week. His drug history is not on file.  Family History: No family history on file.  patient reports that all family members are healthy.   Prior to Admission medications   Not on File    Physical Exam: Filed Vitals:   07/05/14 2300 07/06/14 0045 07/06/14 0300 07/06/14 0351  BP: 124/80 116/83 131/92 132/81  Pulse: 69 66 69 66  Temp:    98.3 F (36.8 C)  TempSrc:    Oral  Resp:    16  Weight:      SpO2: 97% 96% 98% 100%   General: Not in acute distress HEENT:       Eyes: PERRL, EOMI, no scleral icterus.  ENT: No discharge from the ears and nose, no pharynx injection, no tonsillar enlargement.        Neck: No JVD, no bruit, no mass felt. Heme: No neck lymph node enlargement. Cardiac: S1/S2, RRR, No murmurs, No gallops or rubs. Pulm: Good air movement bilaterally. No rales, wheezing, rhonchi or rubs. Abd: Soft, nondistended, tenderness over RUQ, no rebound pain, no organomegaly, BS present. Ext: No edema bilaterally. 2+DP/PT pulse bilaterally. Musculoskeletal: No joint  deformities, No joint redness or warmth, no limitation of ROM in spin. Skin: No rashes.  Neuro: Alert, oriented X3, cranial nerves II-XII grossly intact, muscle strength 5/5 in all extremities, sensation to light touch intact.  Psych: Patient is not psychotic, no suicidal or hemocidal ideation.  Labs on Admission:  Basic Metabolic Panel:  Recent Labs Lab 07/05/14 1823  NA 130*  K 3.9  CL 95*  CO2 24  GLUCOSE 150*  BUN 10  CREATININE 0.96  CALCIUM 8.5*   Liver Function Tests:  Recent Labs Lab 07/05/14 1823  AST 505*  ALT 81*  ALKPHOS 246*  BILITOT 3.7*  PROT 6.3*  ALBUMIN 2.9*    Recent Labs Lab 07/05/14 1823  LIPASE 58*   No results for input(s): AMMONIA in the last 168 hours. CBC:  Recent Labs Lab 07/05/14 1823  WBC 6.9  NEUTROABS 3.9  HGB 13.4  HCT 38.4*  MCV 88.1  PLT 101*   Cardiac Enzymes: No results for input(s): CKTOTAL, CKMB, CKMBINDEX, TROPONINI in the last 168 hours.  BNP (last 3 results) No results for input(s): BNP in the last 8760 hours.  ProBNP (last 3 results) No results for input(s): PROBNP in the last 8760 hours.  CBG:  Recent Labs Lab 07/06/14 0548 07/06/14 0750  GLUCAP 108* 107*    Radiological Exams on Admission: [place] Abdomen Limited  07/06/2014   CLINICAL DATA:  Right upper quadrant abdominal pain.  EXAM: US ABDOMEN LIMITED - RIGHT UPPER QUADRANT  COMPARISON:  None.  FINDINGS: Gallbladder:  Physiologically distended. No gallstones or wall thickening visualized. No sonographic Murphy sign noted. Probable Phrygian cap.  Common bile duct:  Diameter: 6.1 mm.  Liver:  No focal lesion identified. Diffusely increased and patchy parenchymal echogenicity. Normal directional flow in the main portal vein.  IMPRESSION: 1. Borderline prominence of the common bile duct of 6 mm. No gallbladder distention. Significance is uncertain, this may be normal for this patient. 2. Increased hepatic echogenicity most consistent with steatosis.    Electronically Signed   By: Rubye OaksMelanie  Ehinger M.D.   On: 07/06/2014 00:44    EKG: Independently reviewed. Normal EKG  Assessment/Plan Principal Problem:   Abdominal pain Active Problems:   Tobacco abuse   Elevated liver enzymes  Abdominal pain: Etiology is not clear. Lipase is negative. Urinalysis is negative. Patient has abnormal liver function. Ultrasound showed borderline prominence of the common bile duct of 6 mm. No gallbladder distention, has increased hepatic echogenicity most consistent with steatosis. No fever, chills, leukocytosis, less likely to have cholecystitis. ED discussed with on-call gastroenterologist, who suggested to trend liver enzymes.  -will admit to med-surg bed -check hepatitis panel -HIV, UDS  -pain control with morphine prn -Zofran for nausea prn -IVF -repeat CMP  Tobacco abuse and Alcohol abuse: -Did counseling about importance of quitting smoking -Nicotine patch   DVT ppx: SQ Heparin   Code Status: Full code Family Communication: None at bed side.   Disposition Plan: Admit to inpatient   Date of Service 07/06/2014    Lorretta HarpIU, Mykira Hofmeister  [facility] Pager [phone]  If 7PM-7AM, please contact night-coverage [email or web address] Password [email or web address] 07/06/2014, 8:40 AM

## 2014-07-06 NOTE — Progress Notes (Signed)
Walked in room patient was eating food from home. Was npo for endo today. Notified lori PA. Used interpreter service to explain to patient they could not eat. Used interpreter number [id number]. Patient said he did not want procedure. Called lori back to update her.

## 2014-07-06 NOTE — Consult Note (Signed)
Referring Provider: [facility] Primary Care Physician:  No PCP Per Patient Primary Gastroenterologist: unassigned  Reason for Consultation: abnormal LFTs, epigastric pain, melena    HPI: Cole Ward is a 44 y.o. male from [place] who came to the [place] 7 years ago. He speaks Burmese, and history is obtained through a Nurse, learning disabilitytranslator with pacific interpreters.   Patient reports that he has been having abdominal discomfort for the past 2-3 weeks. Pain is located in the epigastric area and right upper quadrant. It is fairly constant, worse on an empty stomach, temporary alleviated with ingestion of food, and then it comes back. It does not radiate. He has nausea associated with the pain and has vomited several times. He has no diarrhea for the past 3 weeks he has been having Black tarry stools. He has felt fatigued over the past 3 weeks but has had no dyspnea with exertion or at rest. He has lost 10 kg in the past 2-1/2 months unintentionally. He denies use of NSAIDs, Goody powders, or any recreational drugs. He admits to drinking 3-5 beers daily for many years., some days more. He smokes 4-10 cigarettes daily for the past 30 years. He has no prior history of hepatitis. He has no dysuria but his urine has been intermittently dark. He was brought to the emergency room where he was found to have abnormal liver functions with an total bili 3.7, alkaline phosphatase 246, AST 505, ALT 81. Abdominal ultrasound showed some mild prominence of common bile duct at 6 mm but no gallbladder distention was seen. He was noted to have hepatic echogenicity most consistent with steatosis. His lipase is 58. He has no dysphagia or early satiety. There is no known family history of gallbladder disease. There is no known family history of colon cancer, colon polyps, or inflammatory bowel disease. He has not had any previous GI problems and has never had an endoscopy or colonoscopy.   Past Medical History  Diagnosis  Date  . Vision decreased   . Tobacco abuse     History reviewed. No pertinent past surgical history.  Prior to Admission medications   Not on File    Current Facility-Administered Medications  Medication Dose Route Frequency Provider Last Rate Last Dose  . 0.9 %  sodium chloride infusion   Intravenous Continuous Lorretta HarpXilin Niu, MD 125 mL/hr at 07/06/14 0533    . alum & mag hydroxide-simeth (MAALOX/MYLANTA) 200-200-20 MG/5ML suspension 30 mL  30 mL Oral Q6H PRN Lorretta HarpXilin Niu, MD      . heparin injection 5,000 Units  5,000 Units Subcutaneous 3 times per day Lorretta HarpXilin Niu, MD   5,000 Units at 07/06/14 0630  . morphine 4 MG/ML injection 2 mg  2 mg Intravenous Q3H PRN Lorretta HarpXilin Niu, MD   2 mg at 07/06/14 0849  . nicotine (NICODERM CQ - dosed in mg/24 hours) patch 21 mg  21 mg Transdermal Daily Lorretta HarpXilin Niu, MD      . ondansetron [facility](ZOFRAN) injection 4 mg  4 mg Intravenous Q8H PRN Lorretta HarpXilin Niu, MD      . pantoprazole (PROTONIX) injection 40 mg  40 mg Intravenous Q12H Ripudeep Jenna LuoK Rai, MD        Allergies as of 07/05/2014  . (No Known Allergies)    No family history on file.  History   Social History  . Marital Status: Single    Spouse Name: N/A  . Number of Children: N/A  . Years of Education: N/A   Occupational History  .  Not on file.   Social History Main Topics  . Smoking status: Current Every Day Smoker -- 0.50 packs/day    Types: Cigarettes  . Smokeless tobacco: Not on file  . Alcohol Use: 4.2 oz/week    7 Cans of beer per week  . Drug Use: Not on file  . Sexual Activity: Not on file   Other Topics Concern  . Not on file   Social History Narrative    Review of Systems: Gen: No fever or chills. Has lost 10 kg. Has been fatigued for the past several weeks CV: Denies chest pain, angina, palpitations, syncope, orthopnea, PND, peripheral edema, and claudication. Resp: Denies dyspnea at rest, dyspnea with exercise,sputum, wheezing, coughing up blood, and pleurisy. GI: As had black stools for  3 weeks GU : Denies urinary burning, blood in urine, urinary frequency, urinary hesitancy, nocturnal urination, and urinary incontinence. MS: Denies joint pain, limitation of movement, and swelling, stiffness, low back pain, extremity pain. Denies muscle weakness, cramps, atrophy.  Derm: Denies rash, itching, dry skin, hives, moles, warts, or unhealing ulcers.  Psych: Denies depression, anxiety, memory loss, suicidal ideation, hallucinations, paranoia, and confusion. Heme: Denies bruising, bleeding, and enlarged lymph nodes. Neuro:  Denies any headaches, dizziness, paresthesias. Endo:  Denies any problems with DM, thyroid, adrenal function.  Physical Exam: Vital signs in last 24 hours: Temp:  [98.3 F (36.8 C)-98.6 F (37 C)] 98.3 F (36.8 C) (05/20 0351) Pulse Rate:  [66-81] 66 (05/20 0351) Resp:  [14-16] 16 (05/20 0351) BP: (114-141)/(79-95) 132/81 mmHg (05/20 0351) SpO2:  [96 %-100 %] 100 % (05/20 0351) Weight:  [121 lb (54.885 kg)] 121 lb (54.885 kg) (05/19 1810)   General:   Alert, thin, non-English-speaking, pleasant and cooperative in NAD Head:  Normocephalic and atraumatic. Eyes:  Sclera clear, no icterus.   Conjunctiva pink. Ears:  Normal auditory acuity. Nose:  No deformity, discharge,  or lesions. Mouth:  No deformity or lesions.   Neck:  Supple; no masses or thyromegaly. Lungs:  Clear throughout to auscultation.   No wheezes, crackles, or rhonchi.  Heart:  Regular rate and rhythm; no murmurs, clicks, rubs,  or gallops. Abdomen:  Soft, tender in epigastric area and right upper quadrant. BS active,nonpalp mass or hsm.   Rectal: Dark stool, heme positive. Msk:  Symmetrical without gross deformities. . Pulses:  Normal pulses noted. Extremities:  Without clubbing or edema. Neurologic: Alert and  oriented x4;  grossly normal neurologically. Skin: Multiple tattoos on upper extremities. Psych:  Alert and cooperative. Normal mood and affect.   Lab Results:  Recent Labs   07/05/14 1823  WBC 6.9  HGB 13.4  HCT 38.4*  PLT 101*   BMET  Recent Labs  07/05/14 1823 07/06/14 0630  NA 130* 136  K 3.9 3.4*  CL 95* 102  CO2 24 26  GLUCOSE 150* 97  BUN 10 <5*  CREATININE 0.96 0.78  CALCIUM 8.5* 8.4*   LFT  Recent Labs  07/06/14 0630  PROT 5.8*  ALBUMIN 2.4*  AST 342*  ALT 64*  ALKPHOS 192*  BILITOT 5.4*      On 07/05/2014 total bili 3.7, ALT 81, AST 505, alkaline phosphatase 246, lipase 58.  PT/INR  Recent Labs  07/06/14 0630  LABPROT 15.7*  INR 1.23     Studies/Results: Kore[place] Abdomen Limited  07/06/2014   CLINICAL DATA:  Right upper quadrant abdominal pain.  EXAM: US ABDOMEN LIMITED - RIGHT UPPER QUADRANT  COMPARISON:  None.  FINDINGS: Gallbladder:  Physiologically  distended. No gallstones or wall thickening visualized. No sonographic Murphy sign noted. Probable Phrygian cap.  Common bile duct:  Diameter: 6.1 mm.  Liver:  No focal lesion identified. Diffusely increased and patchy parenchymal echogenicity. Normal directional flow in the main portal vein.  IMPRESSION: 1. Borderline prominence of the common bile duct of 6 mm. No gallbladder distention. Significance is uncertain, this may be normal for this patient. 2. Increased hepatic echogenicity most consistent with steatosis.   Electronically Signed   By: Rubye Oaks M.D.   On: 07/06/2014 00:44    IMPRESSION/PLAN: 44 year old male with hx ETOH abuse admitted with a three-week history of epigastric and right upper quadrant pain, weight loss, and melena admitted via ER last evening. Noted to have elevation of LFTs. U/S with steatosis, CBD nl.Pt has been having black stools, epigastric pain x 3 weeks, stools heme positive on exam. Pain may be due to ulcer/gastritis. Will plan on EGD today to eval for gastritis, ulcers, varices, etc.( pts nurse has been instructed to hold heparin--last dose was 630 am) Continue PPI. Abnl LFTs  may be from ETOH hepatitis or viral hepatitis--pt from area with  endemic hepatitis.( hepatitis panel pending).Cont bid PPI. Trend LFTs.  Hvozdovic, Moise Boring 07/06/2014,  Pager [phone]  GI ATTENDING  History, laboratories, x-rays reviewed. Patient seen and examined. Agree with consultation as outlined above. History made difficult because align which barrier. However, main issues are abdominal pain, abnormal liver tests, and hemodynamically stable upper GI bleeding. Abnormal liver tests may be from alcohol and/or viral hepatitis. Workup underway. Abdominal pain may be due to ulcer. GI bleeding may be due to retching, Mallory-Weiss, ulcer, or other. Agree with PPI and laboratory studies as ordered. Had planned on upper endoscopy today, but the patient ate a full meal brought in by his family! Plan for upper endoscopy tomorrow with Dr. Elnoria Howard.  Wilhemina Bonito. Eda Keys., M.D. [facility] Division of Gastroenterology

## 2014-07-07 ENCOUNTER — Encounter (HOSPITAL_COMMUNITY): Payer: Self-pay | Admitting: *Deleted

## 2014-07-07 ENCOUNTER — Encounter (HOSPITAL_COMMUNITY)
Admission: EM | Disposition: A | Discharge status: Home / Self Care | Payer: Self-pay | Source: Home / Self Care | Attending: Internal Medicine

## 2014-07-07 DIAGNOSIS — Z720 Tobacco use: Secondary | ICD-10-CM

## 2014-07-07 HISTORY — PX: ESOPHAGOGASTRODUODENOSCOPY: SHX5428

## 2014-07-07 LAB — COMPREHENSIVE METABOLIC PANEL WITH GFR
ALT: 51 U/L (ref 17–63)
AST: 245 U/L — ABNORMAL HIGH (ref 15–41)
Albumin: 2.3 g/dL — ABNORMAL LOW (ref 3.5–5.0)
Alkaline Phosphatase: 158 U/L — ABNORMAL HIGH (ref 38–126)
Anion gap: 7 (ref 5–15)
BUN: <5 mg/dL — ABNORMAL LOW (ref 6–20)
CO2: 29 mmol/L (ref 22–32)
Calcium: 8.8 mg/dL — ABNORMAL LOW (ref 8.9–10.3)
Chloride: 104 mmol/L (ref 101–111)
Creatinine, Ser: 0.79 mg/dL (ref 0.61–1.24)
GFR calc Af Amer: >60 mL/min
GFR calc non Af Amer: >60 mL/min
Glucose, Bld: 100 mg/dL — ABNORMAL HIGH (ref 65–99)
Potassium: 3.7 mmol/L (ref 3.5–5.1)
Sodium: 140 mmol/L (ref 135–145)
Total Bilirubin: 4.7 mg/dL — ABNORMAL HIGH (ref 0.3–1.2)
Total Protein: 5.9 g/dL — ABNORMAL LOW (ref 6.5–8.1)

## 2014-07-07 LAB — HEPATITIS PANEL, ACUTE
HCV AB: NEGATIVE
Hep A IgM: NONREACTIVE
Hep B C IgM: NONREACTIVE
Hepatitis B Surface Ag: NEGATIVE

## 2014-07-07 LAB — GLUCOSE, CAPILLARY: Glucose-Capillary: 90 mg/dL (ref 65–99)

## 2014-07-07 LAB — LIPASE, BLOOD: Lipase: 43 U/L (ref 22–51)

## 2014-07-07 SURGERY — EGD (ESOPHAGOGASTRODUODENOSCOPY)
Anesthesia: Moderate Sedation

## 2014-07-07 MED ORDER — CIPROFLOXACIN HCL 500 MG PO TABS: 500.0000 mg | ORAL_TABLET | Freq: Two times a day (BID) | ORAL | Status: DC

## 2014-07-07 MED ORDER — MIDAZOLAM HCL 10 MG/2ML IJ SOLN
INTRAMUSCULAR | Status: DC | PRN
  Administered 2014-07-07: 2 mg via INTRAVENOUS
  Administered 2014-07-07: 1 mg via INTRAVENOUS

## 2014-07-07 MED ORDER — METRONIDAZOLE 500 MG PO TABS: 500.0000 mg | ORAL_TABLET | Freq: Three times a day (TID) | ORAL | Status: DC

## 2014-07-07 MED ORDER — TRAMADOL HCL 50 MG PO TABS: 50.0000 mg | ORAL_TABLET | Freq: Four times a day (QID) | ORAL | Status: DC | PRN

## 2014-07-07 MED ORDER — DIPHENHYDRAMINE HCL 50 MG/ML IJ SOLN
INTRAMUSCULAR | Status: AC
  Filled 2014-07-07: qty 1

## 2014-07-07 MED ORDER — CIPROFLOXACIN HCL 500 MG PO TABS
500.0000 mg | ORAL_TABLET | Freq: Two times a day (BID) | ORAL | Status: DC
  Administered 2014-07-07: 500 mg via ORAL
  Filled 2014-07-07 (×3): qty 1

## 2014-07-07 MED ORDER — IPRATROPIUM-ALBUTEROL 0.5-2.5 (3) MG/3ML IN SOLN
3.0000 mL | Freq: Once | RESPIRATORY_TRACT | Status: DC
  Filled 2014-07-07: qty 3

## 2014-07-07 MED ORDER — MIDAZOLAM HCL 5 MG/ML IJ SOLN
INTRAMUSCULAR | Status: AC
  Filled 2014-07-07: qty 3

## 2014-07-07 MED ORDER — NICOTINE 21 MG/24HR TD PT24: 21.0000 mg | MEDICATED_PATCH | Freq: Every day | TRANSDERMAL | Status: DC

## 2014-07-07 MED ORDER — PROMETHAZINE HCL 12.5 MG PO TABS: 12.5000 mg | ORAL_TABLET | Freq: Four times a day (QID) | ORAL | Status: DC | PRN

## 2014-07-07 MED ORDER — FENTANYL CITRATE (PF) 100 MCG/2ML IJ SOLN
INTRAMUSCULAR | Status: DC | PRN
  Administered 2014-07-07: 12.5 ug via INTRAVENOUS
  Administered 2014-07-07: 25 ug via INTRAVENOUS

## 2014-07-07 MED ORDER — FENTANYL CITRATE (PF) 100 MCG/2ML IJ SOLN
INTRAMUSCULAR | Status: AC
  Filled 2014-07-07: qty 2

## 2014-07-07 MED ORDER — PANTOPRAZOLE SODIUM 40 MG PO TBEC: 40.0000 mg | DELAYED_RELEASE_TABLET | Freq: Every day | ORAL | Status: DC

## 2014-07-07 MED ORDER — METRONIDAZOLE 500 MG PO TABS
500.0000 mg | ORAL_TABLET | Freq: Three times a day (TID) | ORAL | Status: DC
  Administered 2014-07-07: 500 mg via ORAL
  Filled 2014-07-07 (×4): qty 1

## 2014-07-07 NOTE — Op Note (Signed)
[name] [address] [place], [id number]   ENDOSCOPY PROCEDURE REPORT  PATIENT: Cole Ward, Cole Ward  MR#: [id number] BIRTHDATE: 05-Feb-1971 , 44  yrs. old GENDER: male ENDOSCOPIST:Nanna Ertle Elnoria HowardHung, MD REFERRED BY: PROCEDURE DATE:  07/07/2014 PROCEDURE:   EGD, diagnostic ASA CLASS:    Class II INDICATIONS: epigastric pain. MEDICATION: Versed 3 mg IV and Fentanyl 37.5 mcg IV TOPICAL ANESTHETIC:   none  DESCRIPTION OF PROCEDURE:   After the risks and benefits of the procedure were explained, informed consent was obtained.  The Pentax Gastroscope [id number]  endoscope was introduced through the mouth  and advanced to the second portion of the duodenum .  The instrument was slowly withdrawn as the mucosa was fully examined. Estimated blood loss is zero unless otherwise noted in this procedure report.      FINDINGS: The distal esophagus is suspicious for a mild esophagitis/reflux.  The Z-line was irregular in a couple of areas. No evidence of esophageal varices.  The gastric lumen was also normal.  No evidence of portal HTN, inflammation, ulcerations, erosions, polyps, masses, vascular abnormalities, or fundic varices.  The duodenum was normal.    Retroflexed views revealed no abnormalities.    The scope was then withdrawn from the patient and the procedure completed.  COMPLICATIONS: There were no immediate complications.  ENDOSCOPIC IMPRESSION: 1) Possible reflux disease.  RECOMMENDATIONS: 1) Continue with PPI, but it can be dropped down to QD. 2) Continue course with Cipro/Flagyl. 3) Follow up with [facility] in 2-4 weeks.   _______________________________ eSignedJeani Hawking:  Brinklee Cisse, MD 07/07/2014 9:37 AM     cc:  CPT CODES: ICD CODES:  The ICD and CPT codes recommended by this software are interpretations from the data that the clinical staff has captured with the software.  The verification of the translation of this report to the ICD and CPT  codes and modifiers is the sole responsibility of the health care institution and practicing physician where this report was generated.  [employer] will not be held responsible for the validity of the ICD and CPT codes included on this report.  AMA assumes no liability for data contained or not contained herein. CPT is a Publishing rights managerregistered trademark of the [employer].

## 2014-07-07 NOTE — Discharge Instructions (Signed)
Gastroesophageal Reflux Disease, Adult °Gastroesophageal reflux disease (GERD) happens when acid from your stomach goes into your food pipe (esophagus). The acid can cause a burning feeling in your chest. Over time, the acid can make small holes (ulcers) in your food pipe.  °HOME CARE °· Ask your doctor for advice about: °¨ Losing weight. °¨ Quitting smoking. °¨ Alcohol use. °· Avoid foods and drinks that make your problems worse. You may want to avoid: °¨ Caffeine and alcohol. °¨ Chocolate. °¨ Mints. °¨ Garlic and onions. °¨ Spicy foods. °¨ Citrus fruits, such as oranges, lemons, or limes. °¨ Foods that contain tomato, such as sauce, chili, salsa, and pizza. °¨ Fried and fatty foods. °· Avoid lying down for 3 hours before you go to bed or before you take a nap. °· Eat small meals often, instead of large meals. °· Wear loose-fitting clothing. Do not wear anything tight around your waist. °· Raise (elevate) the head of your bed 6 to 8 inches with wood blocks. Using extra pillows does not help. °· Only take medicines as told by your doctor. °· Do not take aspirin or ibuprofen. °GET HELP RIGHT AWAY IF:  °· You have pain in your arms, neck, jaw, teeth, or back. °· Your pain gets worse or changes. °· You feel sick to your stomach (nauseous), throw up (vomit), or sweat (diaphoresis). °· You feel short of breath, or you pass out (faint). °· Your throw up is green, yellow, black, or looks like coffee grounds or blood. °· Your poop (stool) is red, bloody, or black. °MAKE SURE YOU:  °· Understand these instructions. °· Will watch your condition. °· Will get help right away if you are not doing well or get worse. °Document Released: 07/22/2007 Document Revised: 04/27/2011 Document Reviewed: 08/22/2010 °ExitCare® Patient Information ©2015 [employer]. This information is not intended to replace advice given to you by your health care provider. Make sure you discuss any questions you have with your health care provider. ° °

## 2014-07-07 NOTE — Discharge Summary (Signed)
Physician Discharge Summary   Patient ID: Cole Ward MRN: [id number] DOB/AGE: October 20, 1970 44 y.o.  Admit date: 07/05/2014 Discharge date: 07/07/2014  Primary Care Physician:  No PCP Per Patient  Discharge Diagnoses:    . 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   Mild colitis  . Tobacco abuse . Transaminitis . Pancreatitis, acute . GI bleed . Alcohol abuse  Consults:  Gastroenterology, Dr Marina Goodell    Recommendations for Outpatient Follow-up:  Patient was recommended to quit alcohol use completely.  Continue PPI daily, follow-up outpatient with GI in 2-3 weeks   TESTS THAT NEED FOLLOW-UP Please check liver function tests at the time of follow-up appointment   DIET: heart healthy    Allergies:  No Known Allergies   Discharge Medications:   Medication List    TAKE these medications        ciprofloxacin 500 MG tablet  Commonly known as:  CIPRO  Take 1 tablet (500 mg total) by mouth 2 (two) times daily. X 10 days     metroNIDAZOLE 500 MG tablet  Commonly known as:  FLAGYL  Take 1 tablet (500 mg total) by mouth 3 (three) times daily. X 10days     nicotine 21 mg/24hr patch  Commonly known as:  NICODERM CQ - dosed in mg/24 hours  Place 1 patch (21 mg total) onto the skin daily.     pantoprazole 40 MG tablet  Commonly known as:  PROTONIX  Take 1 tablet (40 mg total) by mouth daily.     promethazine 12.5 MG tablet  Commonly known as:  PHENERGAN  Take 1 tablet (12.5 mg total) by mouth every 6 (six) hours as needed for nausea or vomiting.     traMADol 50 MG tablet  Commonly known as:  ULTRAM  Take 1 tablet (50 mg total) by mouth every 6 (six) hours as needed.         Brief H and P: For complete details please refer to admission H and P, but in brief Patient is a 44 year old male with tobacco abuse, alcohol abuse, presented with abdominal pain, speaks Burmese. History was obtained via translator. Patient reported that he had been having  abdominal pain in the past 2 weeks, intermittent, worsening in the past 2-3 days prior to admission. Reported as located in the right upper quadrant, moderate, nonradiating. It is associated with nausea and vomiting. He vomitted almost every times when eating food in the past 2 days. He denied any diarrhea. No symptoms of UTI. He had mild cough due to smoking, which had not changed. No chest pain. Patient reports that he smokes 4-5 cigarettes per day in the past 30 years. He drinks beers very little. In ED, patient was found to have abnormal liver function with AST 505, ALP 81, total bilirubin 3.7, ALP 246. Abdominal ultrasound showed borderline prominence of the common bile duct of 6 mm. No gallbladder distention, has increased hepatic echogenicity most consistent with steatosis. WBC 6.9, lipase 58, negative urinalysis, lactate 1.13. temperature normal, no tachycardia, electrolytes okay. Patient is admitted to inpatient for further evaluation and treatment.  Hospital Course:  Abdominal pain, epigastric with GI bleed, melanotic stools for 3 weeks: Also possibly due to colitis Gastroenterology was consulted due to multiple issues including transaminitis. Patient underwent a CT abdomen and pelvis which showed colitis. He also underwent endoscopy which showed distal esophagus suspicious for mild esophageal tinnitus/reflux. GI recommended daily PPI. He was placed on oral ciprofloxacin and Flagyl for 10 days.  transaminitis  : Likely fatty steatosis ultrasound of the abdomen showed borderline CBD 6 mm, no gallbladder distention, steatosis. Hepatitis panel was negative. Patient was initially placed on IV fluid hydration, pain control and NPO status. CT abdomen and pelvis was negative for acute pancreatitis, lipase 43. LFTs has been trending down at the time of discharge. If patient is tolerating solid diet. Please follow LFTs at the time of appointment.   Tobacco abuse - Counseled on smoking cessation     Alcohol abuse - Patient drinks 3-5 beers daily for many years, he was placed on CIWA protocol with ativan, did not go through acute alcohol withdrawals. Patient was counseled strongly to quit drinking alcohol.   Day of Discharge BP 103/71 mmHg  Pulse 68  Temp(Src) 98.4 F (36.9 C) (Oral)  Resp 16  Ht 5' (1.524 m)  Wt 54.885 kg (121 lb)  SpO2 98%  Physical Exam: General: Alert and awake oriented x3 not in any acute distress. HEENT: anicteric sclera, pupils reactive to light and accommodation CVS: S1-S2 clear no murmur rubs or gallops Chest: clear to auscultation bilaterally, no wheezing rales or rhonchi Abdomen: soft nontender, nondistended, normal bowel sounds Extremities: no cyanosis, clubbing or edema noted bilaterally Neuro: Cranial nerves II-XII intact, no focal neurological deficits   The results of significant diagnostics from this hospitalization (including imaging, microbiology, ancillary and laboratory) are listed below for reference.    LAB RESULTS: Basic Metabolic Panel:  Recent Labs Lab 07/06/14 0630 07/07/14 0624  NA 136 140  K 3.4* 3.7  CL 102 104  CO2 26 29  GLUCOSE 97 100*  BUN <5* <5*  CREATININE 0.78 0.79  CALCIUM 8.4* 8.8*   Liver Function Tests:  Recent Labs Lab 07/06/14 0630 07/07/14 0624  AST 342* 245*  ALT 64* 51  ALKPHOS 192* 158*  BILITOT 5.4* 4.7*  PROT 5.8* 5.9*  ALBUMIN 2.4* 2.3*    Recent Labs Lab 07/05/14 1823 07/07/14 0624  LIPASE 58* 43   No results for input(s): AMMONIA in the last 168 hours. CBC:  Recent Labs Lab 07/05/14 1823  WBC 6.9  NEUTROABS 3.9  HGB 13.4  HCT 38.4*  MCV 88.1  PLT 101*   Cardiac Enzymes: No results for input(s): CKTOTAL, CKMB, CKMBINDEX, TROPONINI in the last 168 hours. BNP: Invalid input(s): POCBNP CBG:  Recent Labs Lab 07/06/14 0750 07/07/14 0930  GLUCAP 107* 90    Significant Diagnostic Studies:  Ct Abdomen Pelvis W Contrast  07/06/2014   CLINICAL DATA:  Right  side abdominal pain for over 2 weeks. Nausea, vomiting.  EXAM: CT ABDOMEN AND PELVIS WITH CONTRAST  TECHNIQUE: Multidetector CT imaging of the abdomen and pelvis was performed using the standard protocol following bolus administration of intravenous contrast.  CONTRAST:  100mL OMNIPAQUE IOHEXOL 300 MG/ML  SOLN  COMPARISON:  Ultrasound 07/05/2014  FINDINGS: Lower chest: Lung bases are clear. No effusions. Heart is normal size.  Hepatobiliary: Diffuse fatty infiltration of the liver with areas of focal fatty sparing within the left hepatic lobe. No focal abnormality. Gallbladder unremarkable.  Pancreas: No focal abnormality or ductal dilatation.  Spleen: No focal abnormality.  Normal size.  Adrenals/Urinary Tract: No focal renal or adrenal abnormality. No hydronephrosis. Urinary bladder is unremarkable.  Stomach/Bowel: Apparent wall thickening within the ascending colon and hepatic flexure concerning for colitis. Remainder of the colon is unremarkable. Stomach and small bowel unremarkable. Appendix is visualized and is normal.  Vascular/Lymphatic: No retroperitoneal or mesenteric adenopathy. Aorta normal caliber.  Reproductive: No mass or  other significant abnormality.  Other: Trace free fluid in the pelvis.  Musculoskeletal: No focal bone lesion or acute bony abnormality.  IMPRESSION: Mild wall thickening within the right colon compatible with colitis. Trace free fluid in the pelvis.   Electronically Signed   By: Charlett Nose M.D.   On: 07/06/2014 19:23   US Abdomen Limited  07/06/2014   CLINICAL DATA:  Right upper quadrant abdominal pain.  EXAM: US ABDOMEN LIMITED - RIGHT UPPER QUADRANT  COMPARISON:  None.  FINDINGS: Gallbladder:  Physiologically distended. No gallstones or wall thickening visualized. No sonographic Murphy sign noted. Probable Phrygian cap.  Common bile duct:  Diameter: 6.1 mm.  Liver:  No focal lesion identified. Diffusely increased and patchy parenchymal echogenicity. Normal directional flow in  the main portal vein.  IMPRESSION: 1. Borderline prominence of the common bile duct of 6 mm. No gallbladder distention. Significance is uncertain, this may be normal for this patient. 2. Increased hepatic echogenicity most consistent with steatosis.   Electronically Signed   By: Rubye Oaks M.D.   On: 07/06/2014 00:44       Disposition and Follow-up:     Discharge Instructions    Diet - low sodium heart healthy    Complete by:  As directed      Discharge instructions    Complete by:  As directed   Please stop drinking alcohol.     Increase activity slowly    Complete by:  As directed             DISPOSITION:Home  DISCHARGE FOLLOW-UP Follow-up Information    Follow up with [facility]     On 07/12/2014.   Why:  9 am for hospital follow up   Contact information:   [address] [place] [facility] [address] [phone]      Follow up with Theda Belfast, MD. Schedule an appointment as soon as possible for a visit in 2 weeks.   Specialty:  Gastroenterology   Why:  for hospital follow-up   Contact information:   [address] [place] [address] [phone]        Time spent on Discharge: 35 mins  Signed:   Havannah Streat M.D. [facility] 07/07/2014, 9:45 AM Pager: [phone]

## 2014-07-07 NOTE — Progress Notes (Signed)
Used [employer] to complete admission questions, assessment, and reaffirm that patient understands he is to be NPO for his EGD scheduled today.

## 2014-07-07 NOTE — Progress Notes (Signed)
Patient discharge teaching given, including activity, diet, follow-up appoints, and medications. Patient verbalized understanding of all discharge instructions. IV access was d/c'd. Vitals are stable. Skin is intact except as charted in most recent assessments. Pt to be escorted out by NT, to be driven home by family. 

## 2014-07-09 ENCOUNTER — Encounter (HOSPITAL_COMMUNITY): Payer: Self-pay | Admitting: Gastroenterology

## 2014-07-12 ENCOUNTER — Ambulatory Visit: Payer: Self-pay | Attending: Family Medicine | Admitting: Family Medicine

## 2014-07-12 ENCOUNTER — Encounter: Payer: Self-pay | Admitting: Family Medicine

## 2014-07-12 VITALS — BP 125/80 | HR 96 | Temp 98.1°F | Resp 18 | Ht 64.0 in | Wt 126.0 lb

## 2014-07-12 DIAGNOSIS — F1010 Alcohol abuse, uncomplicated: Secondary | ICD-10-CM | POA: Insufficient documentation

## 2014-07-12 DIAGNOSIS — R7401 Elevation of levels of liver transaminase levels: Secondary | ICD-10-CM

## 2014-07-12 DIAGNOSIS — K210 Gastro-esophageal reflux disease with esophagitis: Secondary | ICD-10-CM | POA: Insufficient documentation

## 2014-07-12 DIAGNOSIS — R740 Nonspecific elevation of levels of transaminase and lactic acid dehydrogenase [LDH]: Secondary | ICD-10-CM

## 2014-07-12 DIAGNOSIS — F17210 Nicotine dependence, cigarettes, uncomplicated: Secondary | ICD-10-CM | POA: Insufficient documentation

## 2014-07-12 DIAGNOSIS — Z720 Tobacco use: Secondary | ICD-10-CM

## 2014-07-12 DIAGNOSIS — K209 Esophagitis, unspecified without bleeding: Secondary | ICD-10-CM

## 2014-07-12 DIAGNOSIS — K529 Noninfective gastroenteritis and colitis, unspecified: Secondary | ICD-10-CM

## 2014-07-12 LAB — COMPREHENSIVE METABOLIC PANEL
ALK PHOS: 97 U/L (ref 39–117)
ALT: 23 U/L (ref 0–53)
AST: 74 U/L — AB (ref 0–37)
Albumin: 3.4 g/dL — ABNORMAL LOW (ref 3.5–5.2)
BUN: 8 mg/dL (ref 6–23)
CO2: 27 meq/L (ref 19–32)
Calcium: 9.1 mg/dL (ref 8.4–10.5)
Chloride: 104 mEq/L (ref 96–112)
Creat: 0.79 mg/dL (ref 0.50–1.35)
Glucose, Bld: 92 mg/dL (ref 70–99)
Potassium: 3.9 mEq/L (ref 3.5–5.3)
Sodium: 141 mEq/L (ref 135–145)
Total Bilirubin: 1 mg/dL (ref 0.2–1.2)
Total Protein: 6.4 g/dL (ref 6.0–8.3)

## 2014-07-12 MED ORDER — PANTOPRAZOLE SODIUM 40 MG PO TBEC: 40.0000 mg | DELAYED_RELEASE_TABLET | Freq: Every day | ORAL | Status: DC

## 2014-07-12 NOTE — Progress Notes (Signed)
Subjective:    Patient ID: Cole Ward, male    DOB: [birth year], 44 y.o.   MRN: [id number]  HPI  Admit Date: 07/05/14 Discharge Date: 5/21/6  Cole Ward is seen with the aid of Burmese interpreter from [employer] : Moo Zar. He had presented to the ED with RUQ abdominal pain, nausea and vomiting with no diarrhea but had endorsed having melena and history was positive for alcohol abuse.  In ED, patient was afebrile, WBC was 6.9, he was found to have abnormal liver function with AST 505, ALP 81, total bilirubin 3.7, ALP 246. Abdominal ultrasound showed borderline prominence of the common bile duct of 6 mm. No gallbladder distention, increased hepatic echogenicity most consistent with steatosis. WBC 6.9, lipase 58, negative urinalysis, lactate 1.13 He had a CT abdomen and pelvis which revealed colitis. He underwent endoscopy which revealed esophagitis and reflux and he was placed on a PPI, Cipro and Flagyl.He was also placed on CIWA protocol due to a history of alcohol abuse He was also placed on IV fluids with resulting improvement in symptoms and  LFTs trended down to an AST of 245, ALT of 51, alk phos of 158, bilirubin of 4.7 prior to discharge.   Interval History: He reports having a better appetitie and denies abdominal pain, nausea or vomiting. Last drink of alcohol was a month ago.  Past Medical History  Diagnosis Date  . Vision decreased   . Tobacco abuse     Past Surgical History  Procedure Laterality Date  . Esophagogastroduodenoscopy N/A 07/07/2014    Procedure: ESOPHAGOGASTRODUODENOSCOPY (EGD);  Surgeon: Carol Ada, MD;  Location: [facility] ENDOSCOPY;  Service: Endoscopy;  Laterality: N/A;    History   Social History  . Marital Status: Single    Spouse Name: N/A  . Number of Children: N/A  . Years of Education: N/A   Occupational History  . Not on file.   Social History Main Topics  . Smoking status: Current Every Day Smoker -- 0.25 packs/day    Types: Cigarettes  .  Smokeless tobacco: Not on file  . Alcohol Use: Yes     Comment: occassional  . Drug Use: No  . Sexual Activity: Not on file   Other Topics Concern  . Not on file   Social History Narrative    History reviewed. No pertinent family history.  No Known Allergies  Current Outpatient Prescriptions on File Prior to Visit  Medication Sig Dispense Refill  . ciprofloxacin (CIPRO) 500 MG tablet Take 1 tablet (500 mg total) by mouth 2 (two) times daily. X 10 days 20 tablet 0  . metroNIDAZOLE (FLAGYL) 500 MG tablet Take 1 tablet (500 mg total) by mouth 3 (three) times daily. X 10days 30 tablet 0  . nicotine (NICODERM CQ - DOSED IN MG/24 HOURS) 21 mg/24hr patch Place 1 patch (21 mg total) onto the skin daily. 28 patch 3  . promethazine (PHENERGAN) 12.5 MG tablet Take 1 tablet (12.5 mg total) by mouth every 6 (six) hours as needed for nausea or vomiting. 30 tablet 0  . traMADol (ULTRAM) 50 MG tablet Take 1 tablet (50 mg total) by mouth every 6 (six) hours as needed. 30 tablet 0   No current facility-administered medications on file prior to visit.     Review of Systems  Constitutional: Negative for activity change and appetite change.  HENT: Negative for sinus pressure and sore throat.   Eyes: Negative for visual disturbance.  Respiratory: Negative for chest tightness and shortness  of breath.   Cardiovascular: Negative for chest pain and palpitations.  Gastrointestinal: Negative for abdominal pain and abdominal distention.  Endocrine: Negative for cold intolerance, heat intolerance and polyphagia.  Genitourinary: Negative for dysuria, frequency and difficulty urinating.  Musculoskeletal: Negative for back pain, joint swelling and arthralgias.  Skin: Negative for color change.  Neurological: Negative for dizziness, tremors and weakness.  Psychiatric/Behavioral: Negative for suicidal ideas and behavioral problems.         Objective: Filed Vitals:   07/12/14 0910  BP: 125/80  Pulse: 96   Temp: 98.1 F (36.7 C)  TempSrc: Oral  Resp: 18  Height: '5\' 4"'  (1.626 m)  Weight: 126 lb (57.153 kg)  SpO2: 100%      Physical Exam  Constitutional: He is oriented to person, place, and time. He appears well-developed and well-nourished.  HENT:  Head: Normocephalic and atraumatic.  Right Ear: External ear normal.  Left Ear: External ear normal.  Eyes: Conjunctivae and EOM are normal. Pupils are equal, round, and reactive to light.  Neck: Normal range of motion. Neck supple. No tracheal deviation present.  Cardiovascular: Normal rate, regular rhythm and normal heart sounds.   No murmur heard. Pulmonary/Chest: Effort normal and breath sounds normal. No respiratory distress. He has no wheezes. He exhibits no tenderness.  Abdominal: Soft. Bowel sounds are normal. He exhibits no mass. There is no tenderness.  Musculoskeletal: Normal range of motion. He exhibits no edema or tenderness.  Neurological: He is alert and oriented to person, place, and time.  Skin: Skin is warm and dry.  Psychiatric: He has a normal mood and affect.      EXAM: CT ABDOMEN AND PELVIS WITH CONTRAST  TECHNIQUE: Multidetector CT imaging of the abdomen and pelvis was performed using the standard protocol following bolus administration of intravenous contrast.  CONTRAST: 160m OMNIPAQUE IOHEXOL 300 MG/ML SOLN  COMPARISON: Ultrasound 07/05/2014  FINDINGS: Lower chest: Lung bases are clear. No effusions. Heart is normal size.  Hepatobiliary: Diffuse fatty infiltration of the liver with areas of focal fatty sparing within the left hepatic lobe. No focal abnormality. Gallbladder unremarkable.  Pancreas: No focal abnormality or ductal dilatation.  Spleen: No focal abnormality. Normal size.  Adrenals/Urinary Tract: No focal renal or adrenal abnormality. No hydronephrosis. Urinary bladder is unremarkable.  Stomach/Bowel: Apparent wall thickening within the ascending colon and hepatic  flexure concerning for colitis. Remainder of the colon is unremarkable. Stomach and small bowel unremarkable. Appendix is visualized and is normal.  Vascular/Lymphatic: No retroperitoneal or mesenteric adenopathy. Aorta normal caliber.  Reproductive: No mass or other significant abnormality.  Other: Trace free fluid in the pelvis.  Musculoskeletal: No focal bone lesion or acute bony abnormality.  IMPRESSION: Mild wall thickening within the right colon compatible with colitis. Trace free fluid in the pelvis.   Electronically Signed  By: KRolm BaptiseM.D.  On: 07/06/2014 19:23  EXAM: [place]ABDOMEN LIMITED - RIGHT UPPER QUADRANT  COMPARISON: None.  FINDINGS: Gallbladder:  Physiologically distended. No gallstones or wall thickening visualized. No sonographic Murphy sign noted. Probable Phrygian cap.  Common bile duct:  Diameter: 6.1 mm.  Liver:  No focal lesion identified. Diffusely increased and patchy parenchymal echogenicity. Normal directional flow in the main portal vein.  IMPRESSION: 1. Borderline prominence of the common bile duct of 6 mm. No gallbladder distention. Significance is uncertain, this may be normal for this patient. 2. Increased hepatic echogenicity most consistent with steatosis.   Electronically Signed  By: MJeb LeveringM.D.  On: 07/06/2014 00:44  Assessment & Plan:  44 year old male with a history of alcohol abuse recently hospitalized for esophagitis and GERD with findings of colitis on imaging and elevated liver enzymes of unknown etiology.  Esophagitis/ GERD Current episode controlled Continue PPI  Colitis: Asymptomatic. Advised to complete course of metronidazole and ciprofloxacin.   Transaminitis: Unknown etiology, hepatitis panel- negative. I will repeat a comprehensive metabolic panel today to evaluate for improvement.  Tobacco abuse: Smoking cessation support: smoking cessation hotline:  1-800-QUIT-NOW.  Smoking cessation classes are available through [facility]. Call [phone] or visit our website at [email or web address]  Spent 3 minutes counseling on smoking cessation and patient is ready to quit. Advised to use nicotine patches.  Disclaimer: This note was dictated with voice recognition software. Similar sounding words can inadvertently be transcribed and this note may contain transcription errors which may not have been corrected upon publication of note.

## 2014-07-12 NOTE — Patient Instructions (Signed)

## 2014-07-12 NOTE — Progress Notes (Signed)
Patient hospitalized last week for abdominal pain. Patient reprots feeling better. Patient denies pain at this time. Patient has nicotine patch.

## 2014-07-13 ENCOUNTER — Telehealth: Payer: Self-pay

## 2014-07-13 NOTE — Telephone Encounter (Signed)
Nurse called patient via [employer], [place] [phone]. Reached automated message stating "The person you have reached has a voicemail box that has not been set up". Nurse was calling patient to make him aware of his pantoprazole prescription here at the clinic.

## 2014-07-17 NOTE — Telephone Encounter (Signed)
Nurse called patient via [employer], Interpretor number [phone]. Reached automated recording explaining voicemail is not set up.

## 2014-07-17 NOTE — Telephone Encounter (Signed)
-----   Message from Jaclyn ShaggyEnobong Amao, MD sent at 07/15/2014  8:43 PM EDT ----- Liver function has improved.

## 2014-07-23 ENCOUNTER — Other Ambulatory Visit: Payer: Self-pay

## 2014-07-23 DIAGNOSIS — K852 Alcohol induced acute pancreatitis without necrosis or infection: Secondary | ICD-10-CM

## 2014-07-23 MED ORDER — PANTOPRAZOLE SODIUM 40 MG PO TBEC: 40.0000 mg | DELAYED_RELEASE_TABLET | Freq: Every day | ORAL | Status: DC

## 2014-07-23 NOTE — Telephone Encounter (Signed)
Nurse called patient via [employer], Interpretor [phone]. Patient aware of improved liver function. Patient needs protonix. Nurse [facility] patient [facility]. Patient voices understanding and has no questions at this time. [facility].

## 2014-08-08 ENCOUNTER — Ambulatory Visit: Payer: Self-pay

## 2014-08-10 ENCOUNTER — Ambulatory Visit: Payer: Self-pay | Attending: Internal Medicine | Admitting: Internal Medicine

## 2014-08-10 ENCOUNTER — Ambulatory Visit: Payer: Self-pay

## 2014-08-10 VITALS — BP 122/71 | HR 77 | Temp 98.1°F | Resp 16 | Wt 135.8 lb

## 2014-08-10 DIAGNOSIS — K852 Alcohol induced acute pancreatitis without necrosis or infection: Secondary | ICD-10-CM

## 2014-08-10 DIAGNOSIS — K0889 Other specified disorders of teeth and supporting structures: Secondary | ICD-10-CM

## 2014-08-10 DIAGNOSIS — K088 Other specified disorders of teeth and supporting structures: Secondary | ICD-10-CM

## 2014-08-10 NOTE — Progress Notes (Signed)
Patient ID: Cole Ward, male   DOB: Apr 06, 1970, 44 y.o.   MRN: [id number]  CC: dental pain, pancreatitis f/u  HPI: Cole Ward is a 44 y.o. male here today for a follow up visit.  Patient has past medical history of pancreatitis. Patient was admitted in May for pancreatitis. He has since been seen here and reports that he feels much better. Patient has since stopped drinking alcohol completely. He needs refills of his medications.  He has been having left lower dental pain for the past two weeks. He states that he has been having a headache as a result.   Patient has No headache, No chest pain, No abdominal pain - No Nausea, No new weakness tingling or numbness, No Cough - SOB.  No Known Allergies Past Medical History  Diagnosis Date  . Vision decreased   . Tobacco abuse    Current Outpatient Prescriptions on File Prior to Visit  Medication Sig Dispense Refill  . ciprofloxacin (CIPRO) 500 MG tablet Take 1 tablet (500 mg total) by mouth 2 (two) times daily. X 10 days (Patient not taking: Reported on 08/10/2014) 20 tablet 0  . metroNIDAZOLE (FLAGYL) 500 MG tablet Take 1 tablet (500 mg total) by mouth 3 (three) times daily. X 10days (Patient not taking: Reported on 08/10/2014) 30 tablet 0  . nicotine (NICODERM CQ - DOSED IN MG/24 HOURS) 21 mg/24hr patch Place 1 patch (21 mg total) onto the skin daily. (Patient not taking: Reported on 08/10/2014) 28 patch 3  . pantoprazole (PROTONIX) 40 MG tablet Take 1 tablet (40 mg total) by mouth daily. (Patient not taking: Reported on 08/10/2014) 30 tablet 3  . promethazine (PHENERGAN) 12.5 MG tablet Take 1 tablet (12.5 mg total) by mouth every 6 (six) hours as needed for nausea or vomiting. (Patient not taking: Reported on 08/10/2014) 30 tablet 0  . traMADol (ULTRAM) 50 MG tablet Take 1 tablet (50 mg total) by mouth every 6 (six) hours as needed. (Patient not taking: Reported on 08/10/2014) 30 tablet 0   No current facility-administered medications on file prior to visit.    No family history on file. History   Social History  . Marital Status: Single    Spouse Name: N/A  . Number of Children: N/A  . Years of Education: N/A   Occupational History  . Not on file.   Social History Main Topics  . Smoking status: Current Every Day Smoker -- 0.25 packs/day    Types: Cigarettes  . Smokeless tobacco: Not on file  . Alcohol Use: Yes     Comment: occassional  . Drug Use: No  . Sexual Activity: Not on file   Other Topics Concern  . Not on file   Social History Narrative    Review of Systems: See HPI   Objective:   Filed Vitals:   08/10/14 1407  BP: 122/71  Pulse: 77  Temp: 98.1 F (36.7 C)  Resp: 16    Physical Exam: Constitutional: Patient appears well-developed and well-nourished. No distress.. Neck: Normal ROM. Neck supple. No JVD. No tracheal deviation. No thyromegaly. CVS: RRR, S1/S2 +, no murmurs, no gallops, no carotid bruit.  Pulmonary: Effort and breath sounds normal, no stridor, rhonchi, wheezes, rales.  Abdominal: Soft. BS +,  no distension, tenderness, rebound or guarding.  Musculoskeletal: Normal range of motion. No edema and no tenderness.  Neuro: Alert. Normal reflexes Skin: Skin is warm and dry. No rash noted. Not diaphoretic. No erythema. No pallor. Psychiatric: Normal mood and affect. Behavior,  judgment, thought content normal.  Lab Results  Component Value Date   WBC 6.9 07/05/2014   HGB 13.4 07/05/2014   HCT 38.4* 07/05/2014   MCV 88.1 07/05/2014   PLT 101* 07/05/2014   Lab Results  Component Value Date   CREATININE 0.79 07/12/2014   BUN 8 07/12/2014   NA 141 07/12/2014   K 3.9 07/12/2014   CL 104 07/12/2014   CO2 27 07/12/2014    No results found for: HGBA1C Lipid Panel  No results found for: CHOL, TRIG, HDL, CHOLHDL, VLDL, LDLCALC     Assessment and plan:   Cache was seen today for follow-up.  Diagnoses and all orders for this visit:  Pain, dental I will place dental referral once patient  receives orange card.  Alcohol-induced acute pancreatitis Resolved. Went over importance of remaining alcohol freel.      Return if symptoms worsen or fail to improve.   Holland Commons, NP-C [facility] [phone] 08/10/2014, 2:26 PM

## 2014-08-10 NOTE — Progress Notes (Signed)
Pt is here to follow up on Pancreatitis. He states he is doing better. He currently out of all medicine that he was prescribed.

## 2014-08-16 ENCOUNTER — Encounter: Payer: Self-pay | Admitting: Internal Medicine

## 2015-03-06 ENCOUNTER — Emergency Department (HOSPITAL_COMMUNITY): Payer: Self-pay

## 2015-03-06 ENCOUNTER — Encounter (HOSPITAL_COMMUNITY): Payer: Self-pay | Admitting: Emergency Medicine

## 2015-03-06 ENCOUNTER — Emergency Department (HOSPITAL_COMMUNITY)
Admission: EM | Admit: 2015-03-06 | Discharge: 2015-03-06 | Disposition: A | Discharge status: Home / Self Care | Payer: Self-pay | Attending: Emergency Medicine | Admitting: Emergency Medicine

## 2015-03-06 DIAGNOSIS — F17210 Nicotine dependence, cigarettes, uncomplicated: Secondary | ICD-10-CM | POA: Insufficient documentation

## 2015-03-06 DIAGNOSIS — Z8669 Personal history of other diseases of the nervous system and sense organs: Secondary | ICD-10-CM | POA: Insufficient documentation

## 2015-03-06 DIAGNOSIS — R1110 Vomiting, unspecified: Secondary | ICD-10-CM | POA: Insufficient documentation

## 2015-03-06 DIAGNOSIS — J069 Acute upper respiratory infection, unspecified: Secondary | ICD-10-CM | POA: Insufficient documentation

## 2015-03-06 MED ORDER — FLUTICASONE PROPIONATE 50 MCG/ACT NA SUSP: 1.0000 | Freq: Every day | NASAL | Status: DC

## 2015-03-06 MED ORDER — BENZONATATE 100 MG PO CAPS: 100.0000 mg | ORAL_CAPSULE | Freq: Three times a day (TID) | ORAL | Status: DC

## 2015-03-06 NOTE — ED Notes (Signed)
Used language line to discharge patient.  Pt verbalized understanding of discharge instructions.

## 2015-03-06 NOTE — Discharge Instructions (Signed)
1. Medications: flonase, over the counter mucinex, tessalon, continue usual home medications 2. Treatment: rest, drink plenty of fluids, take tylenol or ibuprofen for fever control 3. Follow Up: Please follow up with your primary doctor in 3 days for discussion of your diagnoses and further evaluation after today's visit; Return to the ER for high fevers, difficulty breathing or other concerning symptoms

## 2015-03-06 NOTE — ED Notes (Addendum)
Per EMS-states cough for 4 days, nonproductive-states he went back to work and started having coughing fit, staff called EMS because he was shaking-states he had coughing spell in ambulance and vomited one time-4 mg of IV Zofran given in route-states he took cough cough med that has alcohol in it-patient has history of pancreatitis-per EMS states he felt better after vomiting and Zofran-no night sweats, no blood in sputum

## 2015-03-06 NOTE — ED Provider Notes (Signed)
CSN: [id number]     Arrival date & time 03/06/15  1143 History   First MD Initiated Contact with Patient 03/06/15 1211     Chief Complaint  Patient presents with  . Cough     (Consider location/radiation/quality/duration/timing/severity/associated sxs/prior Treatment) Patient is a 45 y.o. male presenting with cough. The history is provided by the patient and medical records.  Cough Associated symptoms: rhinorrhea   Associated symptoms: no chills, no fever, no headaches, no myalgias, no rash, no shortness of breath and no wheezing     Lacy Lesko is a 45 y.o. male  with no pertinent PMH who presents to the Emergency Department complaining of persistent nonproductive cough x 4 days. He was at work today, when he had a coughing fit leading to post-tussive emesis. His employer was concerned and called EMS. He has been taking OTC cough and cold medication with little relief. IV zofran given by EMS PTA. No hemoptysis, no night sweats, no fevers. Denies sob, chest pain.    Past Medical History  Diagnosis Date  . Vision decreased   . Tobacco abuse    Past Surgical History  Procedure Laterality Date  . Esophagogastroduodenoscopy N/A 07/07/2014    Procedure: ESOPHAGOGASTRODUODENOSCOPY (EGD);  Surgeon: Jeani Hawking, MD;  Location: [facility] ENDOSCOPY;  Service: Endoscopy;  Laterality: N/A;   No family history on file. Social History  Substance Use Topics  . Smoking status: Current Every Day Smoker -- 0.25 packs/day    Types: Cigarettes  . Smokeless tobacco: None  . Alcohol Use: Yes     Comment: occassional    Review of Systems  Constitutional: Negative for fever and chills.  HENT: Positive for congestion and rhinorrhea.   Eyes: Negative for visual disturbance.  Respiratory: Positive for cough. Negative for shortness of breath and wheezing.   Cardiovascular: Negative.   Gastrointestinal: Negative for nausea, vomiting, abdominal pain, diarrhea and constipation.  Musculoskeletal: Negative for  myalgias, back pain, arthralgias and neck pain.  Skin: Negative for rash.  Allergic/Immunologic: Negative for immunocompromised state.  Neurological: Negative for dizziness, weakness and headaches.      Allergies  Review of patient's allergies indicates no known allergies.  Home Medications   Prior to Admission medications   Medication Sig Start Date End Date Taking? Authorizing Provider  benzonatate (TESSALON) 100 MG capsule Take 1 capsule (100 mg total) by mouth every 8 (eight) hours. 03/06/15   Kahle Mcqueen Pilcher Wende Longstreth, PA-C  fluticasone (FLONASE) 50 MCG/ACT nasal spray Place 1 spray into both nostrils daily. 03/06/15   Chase Picket Asta Corbridge, PA-C  nicotine (NICODERM CQ - DOSED IN MG/24 HOURS) 21 mg/24hr patch Place 1 patch (21 mg total) onto the skin daily. Patient not taking: Reported on 08/10/2014 07/07/14   Ripudeep Jenna Luo, MD  pantoprazole (PROTONIX) 40 MG tablet Take 1 tablet (40 mg total) by mouth daily. Patient not taking: Reported on 08/10/2014 07/23/14   Jaclyn Shaggy, MD  promethazine (PHENERGAN) 12.5 MG tablet Take 1 tablet (12.5 mg total) by mouth every 6 (six) hours as needed for nausea or vomiting. Patient not taking: Reported on 08/10/2014 07/07/14   Ripudeep Jenna Luo, MD  traMADol (ULTRAM) 50 MG tablet Take 1 tablet (50 mg total) by mouth every 6 (six) hours as needed. Patient not taking: Reported on 08/10/2014 07/07/14   Ripudeep K Rai, MD   BP 133/104 mmHg  Pulse 78  Temp(Src) 97.8 F (36.6 C) (Oral)  Resp 16  SpO2 100% Physical Exam  Constitutional: He is oriented to person, place,  and time. He appears well-developed and well-nourished.  Alert and in no acute distress  HENT:  Head: Normocephalic and atraumatic.  Mouth/Throat: Oropharynx is clear and moist. No oropharyngeal exudate.  + nasal congestion  Neck: Normal range of motion. Neck supple. No tracheal deviation present.  Cardiovascular: Normal rate, regular rhythm and normal heart sounds.  Exam reveals no gallop and no  friction rub.   No murmur heard. Pulmonary/Chest: Effort normal and breath sounds normal. No stridor. No respiratory distress. He has no wheezes. He has no rales. He exhibits no tenderness.  Abdominal: Soft. Bowel sounds are normal. He exhibits no distension and no mass. There is no tenderness. There is no rebound and no guarding.  Musculoskeletal: He exhibits no edema.  Lymphadenopathy:    He has no cervical adenopathy.  Neurological: He is alert and oriented to person, place, and time.  Skin: Skin is warm and dry. No rash noted.  Psychiatric: He has a normal mood and affect. His behavior is normal. Judgment and thought content normal.  Nursing note and vitals reviewed.   ED Course  Procedures (including critical care time) Labs Review Labs Reviewed - No data to display  Imaging Review Dg Chest 2 View  03/06/2015  CLINICAL DATA:  Worsening cough over last 4 days, states took cough syrup this a.m, and caused emesis x1, smokes approx 3-4cigs per day EXAM: CHEST  2 VIEW COMPARISON:  None. FINDINGS: Midline trachea. Normal heart size and mediastinal contours. No pleural effusion or pneumothorax. Clear lungs. IMPRESSION: No acute cardiopulmonary disease. Electronically Signed   By: Jeronimo Greaves M.D.   On: 03/06/2015 12:11   I have personally reviewed and evaluated these images and lab results as part of my medical decision-making.   EKG Interpretation None      MDM   Final diagnoses:  Upper respiratory infection   Joren Janco presents with four day history of cough, congestion. One episode of post-tussive emesis. CXR with no acute cardiopulm disease. No fever at home. No hx of lung dz. Likely viral URI  A&P: URI  - Tessalon for cough, flonase, mucinex  - Increase hydration  - PCP follow up  - Return precautions given.   [facility] Jamillia Closson, PA-C 03/06/15 1312  Lorre Nick, MD 03/08/15 [phone]

## 2015-07-22 ENCOUNTER — Encounter (HOSPITAL_COMMUNITY): Payer: Self-pay | Admitting: Nurse Practitioner

## 2015-07-22 ENCOUNTER — Emergency Department (HOSPITAL_COMMUNITY)
Admission: EM | Admit: 2015-07-22 | Discharge: 2015-07-22 | Disposition: A | Discharge status: Home / Self Care | Payer: Self-pay | Attending: Emergency Medicine | Admitting: Emergency Medicine

## 2015-07-22 ENCOUNTER — Emergency Department (HOSPITAL_COMMUNITY): Payer: Self-pay

## 2015-07-22 DIAGNOSIS — Y9289 Other specified places as the place of occurrence of the external cause: Secondary | ICD-10-CM | POA: Insufficient documentation

## 2015-07-22 DIAGNOSIS — Z8669 Personal history of other diseases of the nervous system and sense organs: Secondary | ICD-10-CM | POA: Insufficient documentation

## 2015-07-22 DIAGNOSIS — W2209XA Striking against other stationary object, initial encounter: Secondary | ICD-10-CM | POA: Insufficient documentation

## 2015-07-22 DIAGNOSIS — F17210 Nicotine dependence, cigarettes, uncomplicated: Secondary | ICD-10-CM | POA: Insufficient documentation

## 2015-07-22 DIAGNOSIS — Z79899 Other long term (current) drug therapy: Secondary | ICD-10-CM | POA: Insufficient documentation

## 2015-07-22 DIAGNOSIS — Y998 Other external cause status: Secondary | ICD-10-CM | POA: Insufficient documentation

## 2015-07-22 DIAGNOSIS — Z7951 Long term (current) use of inhaled steroids: Secondary | ICD-10-CM | POA: Insufficient documentation

## 2015-07-22 DIAGNOSIS — Y9389 Activity, other specified: Secondary | ICD-10-CM | POA: Insufficient documentation

## 2015-07-22 DIAGNOSIS — S5001XA Contusion of right elbow, initial encounter: Secondary | ICD-10-CM | POA: Insufficient documentation

## 2015-07-22 MED ORDER — NAPROXEN 500 MG PO TABS: 500.0000 mg | ORAL_TABLET | Freq: Two times a day (BID) | ORAL | Status: DC

## 2015-07-22 NOTE — Discharge Instructions (Signed)
Ice and elevate your arm. Naprosyn for pain and inflammation. Follow up as needed. Xray is normal today.    Elbow Contusion An elbow contusion is a deep bruise of the elbow. Contusions are the result of an injury that caused bleeding under the skin. The contusion may turn blue, purple, or yellow. Minor injuries will give you a painless contusion, but more severe contusions may stay painful and swollen for a few weeks.  CAUSES  An elbow contusion comes from a direct force to that area, such as falling on the elbow. SYMPTOMS   Swelling and redness of the elbow.  Bruising of the elbow area.  Tenderness or soreness of the elbow. DIAGNOSIS  You will have a physical exam and will be asked about your history. You may need an X-ray of your elbow to look for a broken bone (fracture).  TREATMENT  A sling or splint may be needed to support your injury. Resting, elevating, and applying cold compresses to the elbow area are often the best treatments for an elbow contusion. Over-the-counter medicines may also be recommended for pain control. HOME CARE INSTRUCTIONS   Put ice on the injured area.  Put ice in a plastic bag.  Place a towel between your skin and the bag.  Leave the ice on for 15-20 minutes, 03-04 times a day.  Only take over-the-counter or prescription medicines for pain, discomfort, or fever as directed by your caregiver.  Rest your injured elbow until the pain and swelling are better.  Elevate your elbow to reduce swelling.  Apply a compression wrap as directed by your caregiver. This can help reduce swelling and motion. You may remove the wrap for sleeping, showers, and baths. If your fingers become numb, cold, or blue, take the wrap off and reapply it more loosely.  Use your elbow only as directed by your caregiver. You may be asked to do range of motion exercises. Do them as directed.  See your caregiver as directed. It is very important to keep all follow-up appointments in  order to avoid any long-term problems with your elbow, including chronic pain or inability to move your elbow normally. SEEK IMMEDIATE MEDICAL CARE IF:   You have increased redness, swelling, or pain in your elbow.  Your swelling or pain is not relieved with medicines.  You have swelling of the hand and fingers.  You are unable to move your fingers or wrist.  You begin to lose feeling in your hand or fingers.  Your fingers or hand become cold or blue. MAKE SURE YOU:   Understand these instructions.  Will watch your condition.  Will get help right away if you are not doing well or get worse.   This information is not intended to replace advice given to you by your health care provider. Make sure you discuss any questions you have with your health care provider.   Document Released: 01/11/2006 Document Revised: 04/27/2011 Document Reviewed: 09/17/2014 Elsevier Interactive Patient Education [employer]2016 [employer]

## 2015-07-22 NOTE — ED Provider Notes (Signed)
CSN: [id number]     Arrival date & time 07/22/15  1149 History  By signing my name below, I, Freida Busman, attest that this documentation has been prepared under the direction and in the presence of non-physician practitioner, Jaynie Crumble, PA-C. Electronically Signed: Freida Busman, Scribe. 07/22/2015. 1:25 PM.     Chief Complaint  Patient presents with  . Elbow Pain    The history is provided by the patient. A language interpreter was used (Burmese).     HPI Comments:  Cole Ward is a 45 y.o. male who presents to the Emergency Department complaining of moderate, right elbow pain x ~ 1 week. Pt states he struck the elbow on an iron rod. He denies numbness and tingling to the right hand. No alleviating factors noted; pain worse with movent. no treatments tried PTA. Pt does not speak english, language interpreter utilized to obtain history.    Past Medical History  Diagnosis Date  . Vision decreased   . Tobacco abuse    Past Surgical History  Procedure Laterality Date  . Esophagogastroduodenoscopy N/A 07/07/2014    Procedure: ESOPHAGOGASTRODUODENOSCOPY (EGD);  Surgeon: Jeani Hawking, MD;  Location: [facility] ENDOSCOPY;  Service: Endoscopy;  Laterality: N/A;   History reviewed. No pertinent family history. Social History  Substance Use Topics  . Smoking status: Current Every Day Smoker -- 0.25 packs/day    Types: Cigarettes  . Smokeless tobacco: None  . Alcohol Use: Yes     Comment: occassional    Review of Systems  Musculoskeletal: Positive for myalgias and arthralgias.  Neurological: Negative for weakness and numbness.      Allergies  Review of patient's allergies indicates no known allergies.  Home Medications   Prior to Admission medications   Medication Sig Start Date End Date Taking? Authorizing Provider  benzonatate (TESSALON) 100 MG capsule Take 1 capsule (100 mg total) by mouth every 8 (eight) hours. 03/06/15   Jaime Pilcher Ward, PA-C  fluticasone (FLONASE) 50  MCG/ACT nasal spray Place 1 spray into both nostrils daily. 03/06/15   Chase Picket Ward, PA-C  nicotine (NICODERM CQ - DOSED IN MG/24 HOURS) 21 mg/24hr patch Place 1 patch (21 mg total) onto the skin daily. Patient not taking: Reported on 08/10/2014 07/07/14   Ripudeep Jenna Luo, MD  pantoprazole (PROTONIX) 40 MG tablet Take 1 tablet (40 mg total) by mouth daily. Patient not taking: Reported on 08/10/2014 07/23/14   Jaclyn Shaggy, MD  promethazine (PHENERGAN) 12.5 MG tablet Take 1 tablet (12.5 mg total) by mouth every 6 (six) hours as needed for nausea or vomiting. Patient not taking: Reported on 08/10/2014 07/07/14   Ripudeep Jenna Luo, MD  traMADol (ULTRAM) 50 MG tablet Take 1 tablet (50 mg total) by mouth every 6 (six) hours as needed. Patient not taking: Reported on 08/10/2014 07/07/14   Ripudeep K Rai, MD   BP 129/101 mmHg  Pulse 92  Temp(Src) 98 F (36.7 C) (Oral)  Resp 19  Ht  (1.651 m)  Wt 130 lb (58.968 kg)  BMI 21.63 kg/m2  SpO2 98% Physical Exam  Constitutional: He is oriented to person, place, and time. He appears well-developed and well-nourished. No distress.  HENT:  Head: Normocephalic and atraumatic.  Eyes: Conjunctivae are normal.  Cardiovascular: Normal rate.   Pulmonary/Chest: Effort normal.  Abdominal: He exhibits no distension.  Musculoskeletal:  No obvious swelling or deformity noted to the right elbow. Tender to palpation over lateral epicondyle. Range of motion of the elbow joint. Normal forearm, wrist, hand.  Prevacid of 5 strength in the right hand and grip. Patient is able to do thumbs up, spread all his fingers, across his second and third digit. Distal radial pulse intact.  Neurological: He is alert and oriented to person, place, and time.  Skin: Skin is warm and dry.  Psychiatric: He has a normal mood and affect.  Nursing note and vitals reviewed.   ED Course  Procedures  DIAGNOSTIC STUDIES:  Oxygen Saturation is 98% on RA, normal by my interpretation.     COORDINATION OF CARE:  1:22 PM Pt updated with XR results. Discussed treatment plan with pt at bedside and pt agreed to plan.  Imaging Review Dg Elbow Complete Right  07/22/2015  CLINICAL DATA:  Pain after trauma EXAM: RIGHT ELBOW - COMPLETE 3+ VIEW COMPARISON:  None. FINDINGS: There is no evidence of fracture, dislocation, or joint effusion. There is no evidence of arthropathy or other focal bone abnormality. Soft tissues are unremarkable. IMPRESSION: Negative. Electronically Signed   By: Gerome Samavid  Williams III M.D   On: 07/22/2015 13:12   I have personally reviewed and evaluated these images and lab results as part of my medical decision-making.    MDM   Final diagnoses:  Elbow contusion, right, initial encounter   Patient in emergency department with right elbow pain after hitting it on an object at work. Injury occurred a week ago. Patient still having pain. No obvious deformity or abnormality noted on exam. Tender in the lateral epicondyles. X-rays negative. Most likely contusion. Continue NSAIDs. Ace wrap provided for support. Home blood ice, elevation, follow up as needed. Neurovascularly intact. Stable for discharge.  Filed Vitals:   07/22/15 1227  BP: 129/101  Pulse: 92  Temp: 98 F (36.7 C)  Resp: 19   I personally performed the services described in this documentation, which was scribed in my presence. The recorded information has been reviewed and is accurate.    Jaynie Crumbleatyana Paloma Grange, PA-C 07/22/15 1352  Azalia BilisKevin Campos, MD 07/22/15 [phone]

## 2015-07-22 NOTE — ED Notes (Signed)
Pt c/o 1 week history of R elbow pain. Onset after hitting his arm on steel.he has tried nothing at home for the pain. Cms intact.

## 2016-05-06 ENCOUNTER — Ambulatory Visit (INDEPENDENT_AMBULATORY_CARE_PROVIDER_SITE_OTHER): Payer: Self-pay | Admitting: Internal Medicine

## 2016-05-06 ENCOUNTER — Encounter: Payer: Self-pay | Admitting: Internal Medicine

## 2016-05-06 VITALS — BP 142/88 | HR 76 | Resp 12 | Ht 63.0 in | Wt 140.0 lb

## 2016-05-06 DIAGNOSIS — B86 Scabies: Secondary | ICD-10-CM

## 2016-05-06 NOTE — Patient Instructions (Addendum)
We will call Cole Ward with plan for treatment tomorrow regarding likely scabies

## 2016-05-06 NOTE — Progress Notes (Signed)
   Subjective:    Patient ID: Cole Ward, male    DOB: 12/21/1970, 46 y.o.   MRN: [id number]  HPI   Here to establish   Rash:  Has had for 1 year.  Very pruritic.  Started on low thorax, all the way around.  Has spread to back and thighs.   Has not been seen elsewhere for this.   Has tried some topicals from the pharmacy, cannot recall the name of the OTC med.   Lives with wife, Baw Meh, as well as her 3 children.  None of them have a rash.  Does not know what soap they use.   Uses hydrating lotion after showers.     No outpatient prescriptions have been marked as taking for the 05/06/16 encounter (Office Visit) with Julieanne MansonElizabeth Vinson Tietze, MD.   No Known Allergies   Past Medical History:  Diagnosis Date  . Tobacco abuse   . Vision decreased    Past Surgical History:  Procedure Laterality Date  . ESOPHAGOGASTRODUODENOSCOPY N/A 07/07/2014   Procedure: ESOPHAGOGASTRODUODENOSCOPY (EGD);  Surgeon: Jeani HawkingPatrick Hung, MD;  [facility];  Service: Endoscopy;  Laterality: N/A;    History reviewed. No pertinent family history.   Social History   Social History  . Marital status: Married    Spouse name: Baw Meh  . Number of children: N/A  . Years of education: N/A   Occupational History  . unemployed    Social History Main Topics  . Smoking status: Current Every Day Smoker    Packs/day: 0.25    Years: 35.00    Types: Cigarettes  . Smokeless tobacco: Never Used  . Alcohol use 1.8 oz/week    3 Shots of liquor per week  . Drug use: No  . Sexual activity: Not on file   Other Topics Concern  . Not on file   Social History Narrative   Originally from [place]   [place] in [place].   [place]ame [place] In [place]   Lives with wife Baw Meh, and her 3 children from her previous marriage.       Review of Systems     Objective:   Physical Exam  NAD HEENT:  PERRL, EOMI, TMs pearly gray, throat without injection Neck:  Supple, No adenopathy, No  thyromegaly Chest:  CTA CV:  RRR with normal S1 and S2, No S3, S4 or murmur, radial and DP pulses normal and equal Abd:  S, NT, No HSM or mass Skin:  Chronic appearing rash.  Papular lesions and possibly burrows most prominent at waistline, but scattered all over trunk, thighs front and back, mainly upper arms.  No definite interdigital lesions.        Assessment & Plan:  1.  Possible scabies:  [facility] as [facility] now.   [facility]

## 2016-06-10 ENCOUNTER — Ambulatory Visit (INDEPENDENT_AMBULATORY_CARE_PROVIDER_SITE_OTHER): Payer: Self-pay | Admitting: Internal Medicine

## 2016-06-10 ENCOUNTER — Encounter: Payer: Self-pay | Admitting: Internal Medicine

## 2016-06-10 VITALS — BP 112/84 | HR 84 | Resp 12 | Ht 63.0 in | Wt 135.0 lb

## 2016-06-10 DIAGNOSIS — Z720 Tobacco use: Secondary | ICD-10-CM

## 2016-06-10 DIAGNOSIS — F1010 Alcohol abuse, uncomplicated: Secondary | ICD-10-CM

## 2016-06-10 DIAGNOSIS — L309 Dermatitis, unspecified: Secondary | ICD-10-CM

## 2016-06-10 MED ORDER — TRIAMCINOLONE ACETONIDE 0.1 % EX CREA: TOPICAL_CREAM | CUTANEOUS | 1 refills | Status: DC

## 2016-06-10 NOTE — Patient Instructions (Signed)
Dove Soap  Triamcinolone Cream to rash twice daily  Eucerin Cream with Eczema Relief all over twice daily  No hot showers  Get rid of cigarettes, ashtrays, lighters.  Start Nicotine gum 3-4 times daily.  Try to decrease 1 stick of nicotine gum daily every week until off.
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
## 2016-06-10 NOTE — Addendum Note (Signed)
Addended by: Marcene Duos on: 06/10/2016 05:52 PM   Modules accepted: Orders

## 2016-07-10 ENCOUNTER — Other Ambulatory Visit: Payer: Self-pay

## 2016-07-15 ENCOUNTER — Ambulatory Visit: Payer: Self-pay | Admitting: Internal Medicine

## 2017-01-31 ENCOUNTER — Encounter (HOSPITAL_COMMUNITY): Payer: Self-pay | Admitting: *Deleted

## 2017-01-31 ENCOUNTER — Other Ambulatory Visit: Payer: Self-pay

## 2017-01-31 ENCOUNTER — Emergency Department (HOSPITAL_COMMUNITY)
Admission: EM | Admit: 2017-01-31 | Discharge: 2017-02-01 | Disposition: A | Discharge status: Home / Self Care | Payer: Self-pay | Attending: Emergency Medicine | Admitting: Emergency Medicine

## 2017-01-31 DIAGNOSIS — R51 Headache: Secondary | ICD-10-CM | POA: Insufficient documentation

## 2017-01-31 DIAGNOSIS — Z5321 Procedure and treatment not carried out due to patient leaving prior to being seen by health care provider: Secondary | ICD-10-CM | POA: Insufficient documentation

## 2017-01-31 NOTE — ED Notes (Signed)
Pt seen walking outside of ED with friends
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
## 2017-02-01 ENCOUNTER — Other Ambulatory Visit: Payer: Self-pay

## 2017-02-01 ENCOUNTER — Encounter (HOSPITAL_COMMUNITY): Payer: Self-pay

## 2017-02-01 ENCOUNTER — Emergency Department (HOSPITAL_COMMUNITY): Payer: Self-pay

## 2017-02-01 ENCOUNTER — Emergency Department (HOSPITAL_COMMUNITY)
Admission: EM | Admit: 2017-02-01 | Discharge: 2017-02-01 | Disposition: A | Discharge status: Home / Self Care | Payer: Self-pay | Attending: Emergency Medicine | Admitting: Emergency Medicine

## 2017-02-01 DIAGNOSIS — Z23 Encounter for immunization: Secondary | ICD-10-CM | POA: Insufficient documentation

## 2017-02-01 DIAGNOSIS — Y92009 Unspecified place in unspecified non-institutional (private) residence as the place of occurrence of the external cause: Secondary | ICD-10-CM | POA: Insufficient documentation

## 2017-02-01 DIAGNOSIS — S0181XA Laceration without foreign body of other part of head, initial encounter: Secondary | ICD-10-CM | POA: Insufficient documentation

## 2017-02-01 DIAGNOSIS — S022XXA Fracture of nasal bones, initial encounter for closed fracture: Secondary | ICD-10-CM | POA: Insufficient documentation

## 2017-02-01 DIAGNOSIS — Y999 Unspecified external cause status: Secondary | ICD-10-CM | POA: Insufficient documentation

## 2017-02-01 DIAGNOSIS — F17210 Nicotine dependence, cigarettes, uncomplicated: Secondary | ICD-10-CM | POA: Insufficient documentation

## 2017-02-01 DIAGNOSIS — Y939 Activity, unspecified: Secondary | ICD-10-CM | POA: Insufficient documentation

## 2017-02-01 DIAGNOSIS — S0083XA Contusion of other part of head, initial encounter: Secondary | ICD-10-CM

## 2017-02-01 MED ORDER — TETANUS-DIPHTH-ACELL PERTUSSIS 5-2.5-18.5 LF-MCG/0.5 IM SUSP
0.5000 mL | Freq: Once | INTRAMUSCULAR | Status: AC
  Administered 2017-02-01: 0.5 mL via INTRAMUSCULAR
  Filled 2017-02-01: qty 0.5

## 2017-02-01 MED ORDER — LIDOCAINE-EPINEPHRINE (PF) 2 %-1:200000 IJ SOLN
10.0000 mL | Freq: Once | INTRAMUSCULAR | Status: AC
  Administered 2017-02-01: 10 mL
  Filled 2017-02-01: qty 20

## 2017-02-01 MED ORDER — IBUPROFEN 400 MG PO TABS
600.0000 mg | ORAL_TABLET | Freq: Once | ORAL | Status: AC
  Administered 2017-02-01: 600 mg via ORAL
  Filled 2017-02-01: qty 1

## 2017-02-01 NOTE — ED Notes (Signed)
Called for Pt to take to RM. No Answer x3.
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
## 2017-02-01 NOTE — ED Notes (Signed)
ED Provider at bedside. 
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
## 2017-02-01 NOTE — ED Notes (Signed)
Patient transported to CT 

## 2017-02-01 NOTE — ED Notes (Signed)
Provider using stratus to obtain pt history

## 2017-05-27 ENCOUNTER — Emergency Department (HOSPITAL_COMMUNITY)
Admission: EM | Admit: 2017-05-27 | Discharge: 2017-05-28 | Disposition: A | Discharge status: Home / Self Care | Payer: Self-pay | Attending: Emergency Medicine | Admitting: Emergency Medicine

## 2017-05-27 DIAGNOSIS — R251 Tremor, unspecified: Secondary | ICD-10-CM | POA: Insufficient documentation

## 2017-05-27 DIAGNOSIS — F1010 Alcohol abuse, uncomplicated: Secondary | ICD-10-CM | POA: Insufficient documentation

## 2017-05-27 DIAGNOSIS — R1010 Upper abdominal pain, unspecified: Secondary | ICD-10-CM | POA: Insufficient documentation

## 2017-05-27 DIAGNOSIS — R61 Generalized hyperhidrosis: Secondary | ICD-10-CM | POA: Insufficient documentation

## 2017-05-27 DIAGNOSIS — F17210 Nicotine dependence, cigarettes, uncomplicated: Secondary | ICD-10-CM | POA: Insufficient documentation

## 2017-05-27 DIAGNOSIS — R252 Cramp and spasm: Secondary | ICD-10-CM | POA: Insufficient documentation

## 2017-05-27 DIAGNOSIS — R55 Syncope and collapse: Secondary | ICD-10-CM

## 2017-05-27 LAB — COMPREHENSIVE METABOLIC PANEL
ALBUMIN: 3.8 g/dL (ref 3.5–5.0)
ALT: 41 U/L (ref 17–63)
ANION GAP: 15 (ref 5–15)
AST: 186 U/L — ABNORMAL HIGH (ref 15–41)
Alkaline Phosphatase: 127 U/L — ABNORMAL HIGH (ref 38–126)
BILIRUBIN TOTAL: 1.9 mg/dL — AB (ref 0.3–1.2)
BUN: 9 mg/dL (ref 6–20)
CO2: 20 mmol/L — ABNORMAL LOW (ref 22–32)
Calcium: 8.4 mg/dL — ABNORMAL LOW (ref 8.9–10.3)
Chloride: 105 mmol/L (ref 101–111)
Creatinine, Ser: 0.69 mg/dL (ref 0.61–1.24)
GFR calc Af Amer: >60 mL/min (ref >60)
Glucose, Bld: 92 mg/dL (ref 65–99)
POTASSIUM: 3.8 mmol/L (ref 3.5–5.1)
Sodium: 140 mmol/L (ref 135–145)
TOTAL PROTEIN: 7.2 g/dL (ref 6.5–8.1)

## 2017-05-27 LAB — CBC WITH DIFFERENTIAL/PLATELET
BASOS PCT: 0 %
Basophils Absolute: 0 10*3/uL (ref 0.0–0.1)
Eosinophils Absolute: 0.1 10*3/uL (ref 0.0–0.7)
Eosinophils Relative: 1 %
HEMATOCRIT: 43.4 % (ref 39.0–52.0)
Hemoglobin: 14.4 g/dL (ref 13.0–17.0)
Lymphocytes Relative: 15 %
Lymphs Abs: 1.1 10*3/uL (ref 0.7–4.0)
MCH: 31 pg (ref 26.0–34.0)
MCHC: 33.2 g/dL (ref 30.0–36.0)
MCV: 93.3 fL (ref 78.0–100.0)
MONO ABS: 0.5 10*3/uL (ref 0.1–1.0)
MONOS PCT: 7 %
NEUTROS ABS: 5.7 10*3/uL (ref 1.7–7.7)
Neutrophils Relative %: 77 %
Platelets: 78 10*3/uL — ABNORMAL LOW (ref 150–400)
RBC: 4.65 MIL/uL (ref 4.22–5.81)
RDW: 15.3 % (ref 11.5–15.5)
WBC: 7.4 10*3/uL (ref 4.0–10.5)

## 2017-05-27 LAB — ETHANOL: Alcohol, Ethyl (B): <10 mg/dL (ref <10)

## 2017-05-27 LAB — TROPONIN I

## 2017-05-27 LAB — CBG MONITORING, ED: Glucose-Capillary: 91 mg/dL (ref 65–99)

## 2017-05-27 MED ORDER — LORAZEPAM 2 MG/ML IJ SOLN
1.0000 mg | Freq: Once | INTRAMUSCULAR | Status: AC
  Administered 2017-05-27: 1 mg via INTRAVENOUS
  Filled 2017-05-27: qty 1

## 2017-05-27 MED ORDER — ONDANSETRON HCL 4 MG/2ML IJ SOLN
4.0000 mg | Freq: Once | INTRAMUSCULAR | Status: AC
  Administered 2017-05-27: 4 mg via INTRAVENOUS
  Filled 2017-05-27: qty 2

## 2017-05-27 NOTE — ED Provider Notes (Addendum)
[facility] EMERGENCY DEPARTMENT Provider Note   CSN: [id number] Arrival date & time: 05/27/17  1749     History   Chief Complaint Chief Complaint  Patient presents with  . Loss of Consciousness    HPI Cole Ward is a 47 y.o. male.  The history is provided by the patient. No language interpreter was used.  Loss of Consciousness   This is a new problem. The current episode started 3 to 5 hours ago. The problem has been resolved. He lost consciousness for a period of 1 to 5 minutes. The problem is associated with normal activity. Associated symptoms include diaphoresis. He has tried nothing for the symptoms. The treatment provided no relief.  Pt reports he has been drinking a lot for the past 2 weeks.  Pt reports he did not drink today.  Pt reported to passed out in the restroom.  Pt complains of shaking and muscle cramps.    Past Medical History:  Diagnosis Date  . Alcohol-induced pancreatitis   . Tobacco abuse   . Vision decreased     Patient Active Problem List   Diagnosis Date Noted  . Colitis 07/12/2014  . Acute esophagitis 07/12/2014  . Abdominal pain 07/06/2014  . Elevated liver enzymes 07/06/2014  . Transaminitis 07/06/2014  . Pancreatitis, acute 07/06/2014  . GI bleed 07/06/2014  . Alcohol abuse 07/06/2014  . Tobacco abuse     Past Surgical History:  Procedure Laterality Date  . ESOPHAGOGASTRODUODENOSCOPY N/A 07/07/2014   Procedure: ESOPHAGOGASTRODUODENOSCOPY (EGD);  Surgeon: Jeani Hawking, MD;  Location: [facility] ENDOSCOPY;  Service: Endoscopy;  Laterality: N/A;        Home Medications    Prior to Admission medications   Medication Sig Start Date End Date Taking? Authorizing Provider  triamcinolone cream (KENALOG) 0.1 % Apply to affected area twice daily as needed 06/10/16   Julieanne Manson, MD    Family History No family history on file.  Social History Social History   Tobacco Use  . Smoking status: Current Every Day Smoker   Packs/day: 0.25    Years: 35.00    Pack years: 8.75    Types: Cigarettes  . Smokeless tobacco: Never Used  Substance Use Topics  . Alcohol use: Yes    Alcohol/week: 1.8 oz    Types: 3 Shots of liquor per week  . Drug use: No     Allergies   Patient has no known allergies.   Review of Systems Review of Systems  Constitutional: Positive for diaphoresis.  Cardiovascular: Positive for syncope.  All other systems reviewed and are negative.    Physical Exam Updated Vital Signs BP 110/77   Pulse 70   Temp 97.9 F (36.6 C) (Oral)   Resp 16   SpO2 99%   Physical Exam  Constitutional: He appears well-developed and well-nourished.  HENT:  Head: Normocephalic and atraumatic.  Cardiovascular: Normal rate.  Pulmonary/Chest: Effort normal.  Abdominal: Soft.  Musculoskeletal: Normal range of motion. He exhibits tenderness.  Tremor   Nursing note and vitals reviewed.    ED Treatments / Results  Labs (all labs ordered are listed, but only abnormal results are displayed) Labs Reviewed  CBC WITH DIFFERENTIAL/PLATELET - Abnormal; Notable for the following components:      Result Value   Platelets 78 (*)    All other components within normal limits  COMPREHENSIVE METABOLIC PANEL - Abnormal; Notable for the following components:   CO2 20 (*)    Calcium 8.4 (*)  AST 186 (*)    Alkaline Phosphatase 127 (*)    Total Bilirubin 1.9 (*)    All other components within normal limits  TROPONIN I  ETHANOL  RAPID URINE DRUG SCREEN, HOSP PERFORMED  CBG MONITORING, ED    EKG None  Radiology No results found.  Procedures Procedures (including critical care time)  Medications Ordered in ED Medications  LORazepam (ATIVAN) injection 1 mg (1 mg Intravenous Given 05/27/17 1921)  ondansetron (ZOFRAN) injection 4 mg (4 mg Intravenous Given 05/27/17 1921)     Initial Impression / Assessment and Plan / ED Course  I have reviewed the triage vital signs and the nursing  notes.  Pertinent labs & imaging results that were available during my care of the patient were reviewed by me and considered in my medical decision making (see chart for details).     Pt feels better after ativan.  Pt is trying to stop drinking.  I will give rx for librium.   Pt understands to return if any problems.  Final Clinical Impressions(s) / ED Diagnoses   Final diagnoses:  Alcohol abuse  Syncope and collapse    ED Discharge Orders        Ordered    chlordiazePOXIDE (LIBRIUM) 25 MG capsule     05/28/17 0028    An After Visit Summary was printed and given to the patient.    Osie CheeksSofia, Leslie K, PA-C 05/28/17 0028    Tegeler, Canary Brimhristopher J, MD 05/28/17 0036    Elson AreasSofia, Leslie K, PA-C 06/16/17 2048    Tegeler, Canary Brimhristopher J, MD 06/17/17 [id number]

## 2017-05-27 NOTE — ED Notes (Signed)
Food and po fluids given to patient.

## 2017-05-27 NOTE — ED Triage Notes (Signed)
[place] after having a syncopal episode.  Pt is c/o of bilateral hand cramps and chills. Primary Language is Burmese.

## 2017-05-28 MED ORDER — ADULT MULTIVITAMIN W/MINERALS CH
1.0000 | ORAL_TABLET | Freq: Once | ORAL | Status: AC
Start: 2017-05-28 — End: 2017-05-28
  Administered 2017-05-28: 1 via ORAL
  Filled 2017-05-28: qty 1

## 2017-05-28 MED ORDER — THIAMINE HCL 100 MG/ML IJ SOLN
100.0000 mg | Freq: Once | INTRAMUSCULAR | Status: AC
  Administered 2017-05-28: 100 mg via INTRAVENOUS
  Filled 2017-05-28: qty 2

## 2017-05-28 MED ORDER — FOLIC ACID 1 MG PO TABS
1.0000 mg | ORAL_TABLET | Freq: Once | ORAL | Status: AC
  Administered 2017-05-28: 1 mg via ORAL
  Filled 2017-05-28: qty 1

## 2017-05-28 MED ORDER — SODIUM CHLORIDE 0.9 % IV BOLUS
1000.0000 mL | Freq: Once | INTRAVENOUS | Status: AC
  Administered 2017-05-28: 1000 mL via INTRAVENOUS

## 2017-05-28 MED ORDER — CHLORDIAZEPOXIDE HCL 25 MG PO CAPS: ORAL_CAPSULE | ORAL | 0 refills | Status: DC

## 2017-05-28 NOTE — Discharge Instructions (Addendum)
Return if any problems.

## 2017-10-04 ENCOUNTER — Encounter (HOSPITAL_COMMUNITY): Payer: Self-pay | Admitting: *Deleted

## 2017-10-04 ENCOUNTER — Emergency Department (HOSPITAL_COMMUNITY): Payer: Self-pay

## 2017-10-04 ENCOUNTER — Inpatient Hospital Stay (HOSPITAL_COMMUNITY)
Admission: EM | Admit: 2017-10-04 | Discharge: 2017-10-06 | DRG: 439 | Discharge status: Against Medical Advice | Payer: Self-pay | Attending: Internal Medicine | Admitting: Internal Medicine

## 2017-10-04 DIAGNOSIS — E876 Hypokalemia: Secondary | ICD-10-CM | POA: Diagnosis present

## 2017-10-04 DIAGNOSIS — D6959 Other secondary thrombocytopenia: Secondary | ICD-10-CM | POA: Diagnosis present

## 2017-10-04 DIAGNOSIS — F17210 Nicotine dependence, cigarettes, uncomplicated: Secondary | ICD-10-CM | POA: Diagnosis present

## 2017-10-04 DIAGNOSIS — E871 Hypo-osmolality and hyponatremia: Secondary | ICD-10-CM | POA: Diagnosis present

## 2017-10-04 DIAGNOSIS — R109 Unspecified abdominal pain: Secondary | ICD-10-CM

## 2017-10-04 DIAGNOSIS — H547 Unspecified visual loss: Secondary | ICD-10-CM | POA: Diagnosis present

## 2017-10-04 DIAGNOSIS — K852 Alcohol induced acute pancreatitis without necrosis or infection: Principal | ICD-10-CM | POA: Diagnosis present

## 2017-10-04 DIAGNOSIS — D696 Thrombocytopenia, unspecified: Secondary | ICD-10-CM | POA: Diagnosis present

## 2017-10-04 DIAGNOSIS — K8689 Other specified diseases of pancreas: Secondary | ICD-10-CM | POA: Diagnosis present

## 2017-10-04 DIAGNOSIS — K859 Acute pancreatitis without necrosis or infection, unspecified: Secondary | ICD-10-CM | POA: Diagnosis present

## 2017-10-04 DIAGNOSIS — R7401 Elevation of levels of liver transaminase levels: Secondary | ICD-10-CM | POA: Diagnosis present

## 2017-10-04 DIAGNOSIS — K7010 Alcoholic hepatitis without ascites: Secondary | ICD-10-CM | POA: Diagnosis present

## 2017-10-04 DIAGNOSIS — K7030 Alcoholic cirrhosis of liver without ascites: Secondary | ICD-10-CM | POA: Diagnosis present

## 2017-10-04 DIAGNOSIS — Z79899 Other long term (current) drug therapy: Secondary | ICD-10-CM

## 2017-10-04 DIAGNOSIS — F1010 Alcohol abuse, uncomplicated: Secondary | ICD-10-CM | POA: Diagnosis present

## 2017-10-04 DIAGNOSIS — Z5321 Procedure and treatment not carried out due to patient leaving prior to being seen by health care provider: Secondary | ICD-10-CM | POA: Diagnosis not present

## 2017-10-04 DIAGNOSIS — R1013 Epigastric pain: Secondary | ICD-10-CM

## 2017-10-04 DIAGNOSIS — R740 Nonspecific elevation of levels of transaminase and lactic acid dehydrogenase [LDH]: Secondary | ICD-10-CM | POA: Diagnosis present

## 2017-10-04 LAB — COMPREHENSIVE METABOLIC PANEL
ALBUMIN: 4 g/dL (ref 3.5–5.0)
ALK PHOS: 121 U/L (ref 38–126)
ALT: 79 U/L — AB (ref 0–44)
ANION GAP: 16 — AB (ref 5–15)
AST: 499 U/L — ABNORMAL HIGH (ref 15–41)
BUN: 9 mg/dL (ref 6–20)
CALCIUM: 9.1 mg/dL (ref 8.9–10.3)
CHLORIDE: 89 mmol/L — AB (ref 98–111)
CO2: 26 mmol/L (ref 22–32)
Creatinine, Ser: 0.78 mg/dL (ref 0.61–1.24)
GFR calc Af Amer: >60 mL/min (ref >60)
GFR calc non Af Amer: >60 mL/min (ref >60)
GLUCOSE: 109 mg/dL — AB (ref 70–99)
Potassium: 3.9 mmol/L (ref 3.5–5.1)
SODIUM: 131 mmol/L — AB (ref 135–145)
Total Bilirubin: 4.4 mg/dL — ABNORMAL HIGH (ref 0.3–1.2)
Total Protein: 7.5 g/dL (ref 6.5–8.1)

## 2017-10-04 LAB — CBC
HCT: 50 % (ref 39.0–52.0)
HEMOGLOBIN: 16.7 g/dL (ref 13.0–17.0)
MCH: 30.6 pg (ref 26.0–34.0)
MCHC: 33.4 g/dL (ref 30.0–36.0)
MCV: 91.6 fL (ref 78.0–100.0)
Platelets: 47 10*3/uL — ABNORMAL LOW (ref 150–400)
RBC: 5.46 MIL/uL (ref 4.22–5.81)
RDW: 13.2 % (ref 11.5–15.5)
WBC: 8.5 10*3/uL (ref 4.0–10.5)

## 2017-10-04 LAB — LIPASE, BLOOD: LIPASE: 1730 U/L — AB (ref 11–51)

## 2017-10-04 MED ORDER — ACETAMINOPHEN 650 MG RE SUPP: 650.0000 mg | Freq: Four times a day (QID) | RECTAL | Status: DC | PRN

## 2017-10-04 MED ORDER — ADULT MULTIVITAMIN W/MINERALS CH
1.0000 | ORAL_TABLET | Freq: Every day | ORAL | Status: DC
  Administered 2017-10-05: 1 via ORAL

## 2017-10-04 MED ORDER — FOLIC ACID 1 MG PO TABS
1.0000 mg | ORAL_TABLET | Freq: Every day | ORAL | Status: DC
  Administered 2017-10-05: 1 mg via ORAL

## 2017-10-04 MED ORDER — SODIUM CHLORIDE 0.9 % IV BOLUS
1000.0000 mL | Freq: Once | INTRAVENOUS | Status: AC
  Administered 2017-10-04: 1000 mL via INTRAVENOUS

## 2017-10-04 MED ORDER — LORAZEPAM 1 MG PO TABS: 1.0000 mg | ORAL_TABLET | Freq: Four times a day (QID) | ORAL | Status: DC | PRN

## 2017-10-04 MED ORDER — IOPAMIDOL (ISOVUE-300) INJECTION 61%
INTRAVENOUS | Status: AC
  Filled 2017-10-04: qty 100

## 2017-10-04 MED ORDER — ONDANSETRON HCL 4 MG/2ML IJ SOLN
4.0000 mg | Freq: Once | INTRAMUSCULAR | Status: AC
  Administered 2017-10-04: 4 mg via INTRAVENOUS
  Filled 2017-10-04: qty 2

## 2017-10-04 MED ORDER — MORPHINE SULFATE (PF) 2 MG/ML IV SOLN: 2.0000 mg | INTRAVENOUS | Status: DC | PRN

## 2017-10-04 MED ORDER — VITAMIN B-1 100 MG PO TABS
100.0000 mg | ORAL_TABLET | Freq: Every day | ORAL | Status: DC
  Administered 2017-10-05: 100 mg via ORAL

## 2017-10-04 MED ORDER — THIAMINE HCL 100 MG/ML IJ SOLN: 100.0000 mg | Freq: Every day | INTRAMUSCULAR | Status: DC

## 2017-10-04 MED ORDER — THIAMINE HCL 100 MG/ML IJ SOLN
Freq: Once | INTRAVENOUS | Status: AC
  Administered 2017-10-04: 22:00:00 via INTRAVENOUS
  Filled 2017-10-04: qty 1000

## 2017-10-04 MED ORDER — IOPAMIDOL (ISOVUE-300) INJECTION 61%
100.0000 mL | Freq: Once | INTRAVENOUS | Status: AC | PRN
  Administered 2017-10-04: 100 mL via INTRAVENOUS

## 2017-10-04 MED ORDER — FENTANYL CITRATE (PF) 100 MCG/2ML IJ SOLN
100.0000 ug | Freq: Once | INTRAMUSCULAR | Status: AC
  Administered 2017-10-04: 100 ug via INTRAVENOUS
  Filled 2017-10-04: qty 2

## 2017-10-04 MED ORDER — LORAZEPAM 2 MG/ML IJ SOLN
1.0000 mg | Freq: Once | INTRAMUSCULAR | Status: AC
  Administered 2017-10-04: 1 mg via INTRAVENOUS
  Filled 2017-10-04: qty 1

## 2017-10-04 MED ORDER — ACETAMINOPHEN 325 MG PO TABS: 650.0000 mg | ORAL_TABLET | Freq: Four times a day (QID) | ORAL | Status: DC | PRN

## 2017-10-04 MED ORDER — ONDANSETRON HCL 4 MG/2ML IJ SOLN: 4.0000 mg | Freq: Four times a day (QID) | INTRAMUSCULAR | Status: DC | PRN

## 2017-10-04 MED ORDER — LORAZEPAM 2 MG/ML IJ SOLN: 1.0000 mg | Freq: Four times a day (QID) | INTRAMUSCULAR | Status: DC | PRN

## 2017-10-04 MED ORDER — SODIUM CHLORIDE 0.9 % IV SOLN
INTRAVENOUS | Status: DC
  Administered 2017-10-05 (×2): via INTRAVENOUS

## 2017-10-04 MED ORDER — ONDANSETRON HCL 4 MG PO TABS: 4.0000 mg | ORAL_TABLET | Freq: Four times a day (QID) | ORAL | Status: DC | PRN

## 2017-10-04 NOTE — ED Provider Notes (Signed)
[facility] EMERGENCY DEPARTMENT Provider Note   CSN: [id number] Arrival date & time: 10/04/17  1236     History   Chief Complaint Chief Complaint  Patient presents with  . Abdominal Pain    HPI Ellwood Steidle is a 47 y.o. male past medical history of alcohol induced pancreatitis, transaminitis, GI bleed, alcohol abuse who presents for evaluation of abdominal pain that began last night.  Patient reports since last night, he felt like his stomach is getting larger and is having pain.  He states that hurts all over but mostly in the upper part of his abdomen.  He reports several episodes of nonbloody, nonbilious vomiting since yesterday.  Reports he has not been able to tolerate much p.o. secondary to symptoms.  States his last bowel movements approxi-3 days ago.  No blood noted.  Patient states he has not been running any fever.  He states he had a similar episode previously  The history is provided by the patient. The history is limited by a language barrier. A language interpreter was used.    Past Medical History:  Diagnosis Date  . Alcohol-induced pancreatitis   . Tobacco abuse   . Vision decreased     Patient Active Problem List   Diagnosis Date Noted  . Thrombocytopenia (HCC) 10/04/2017  . Acute alcoholic pancreatitis 10/04/2017  . Colitis 07/12/2014  . Acute esophagitis 07/12/2014  . Abdominal pain 07/06/2014  . Elevated liver enzymes 07/06/2014  . Transaminitis 07/06/2014  . GI bleed 07/06/2014  . Alcohol abuse 07/06/2014  . Tobacco abuse     Past Surgical History:  Procedure Laterality Date  . ESOPHAGOGASTRODUODENOSCOPY N/A 07/07/2014   Procedure: ESOPHAGOGASTRODUODENOSCOPY (EGD);  Surgeon: Jeani Hawking, MD;  Location: [facility] ENDOSCOPY;  Service: Endoscopy;  Laterality: N/A;        Home Medications    Prior to Admission medications   Medication Sig Start Date End Date Taking? Authorizing Provider  Multiple Vitamin (MULTIVITAMIN WITH MINERALS) TABS  tablet Take 1 tablet by mouth daily.   Yes [provider]  chlordiazePOXIDE (LIBRIUM) 25 MG capsule 50mg  PO TID x 1D, then 25-50mg  PO BID X 1D, then 25-50mg  PO QD X 1D Patient not taking: Reported on 10/04/2017 05/28/17   Elson Areas, PA-C  triamcinolone cream (KENALOG) 0.1 % Apply to affected area twice daily as needed Patient not taking: Reported on 10/04/2017 06/10/16   Julieanne Manson, MD    Family History History reviewed. No pertinent family history.  Social History Social History   Tobacco Use  . Smoking status: Current Every Day Smoker    Packs/day: 0.25    Years: 35.00    Pack years: 8.75    Types: Cigarettes  . Smokeless tobacco: Never Used  Substance Use Topics  . Alcohol use: Yes    Alcohol/week: 3.0 standard drinks    Types: 3 Shots of liquor per week  . Drug use: No     Allergies   Patient has no known allergies.   Review of Systems Review of Systems   Physical Exam Updated Vital Signs BP (!) 143/112   Pulse 66   Temp 99 F (37.2 C) (Oral)   Resp 17   SpO2 93%   Physical Exam  Constitutional: He is oriented to person, place, and time. He appears well-developed and well-nourished.  Appears uncomfortable  HENT:  Head: Normocephalic and atraumatic.  Mouth/Throat: Oropharynx is clear and moist and mucous membranes are normal.  Eyes: Pupils are equal, round, and reactive  to light. Conjunctivae, EOM and lids are normal.  Neck: Full passive range of motion without pain.  Cardiovascular: Normal rate, regular rhythm, normal heart sounds and normal pulses. Exam reveals no gallop and no friction rub.  No murmur heard. Pulmonary/Chest: Effort normal and breath sounds normal.  Lungs clear to auscultation bilaterally.  Symmetric chest rise.  No wheezing, rales, rhonchi.  Abdominal: Soft. Normal appearance and bowel sounds are normal. There is tenderness in the right upper quadrant, epigastric area and left upper quadrant. There is guarding. There  is no rigidity.  Abdomen is soft, nondistended.  Tenderness noted to the right upper quadrant, epigastric, left upper quadrant region.  No rigidity.  There is some mild voluntary guarding.    Musculoskeletal: Normal range of motion.  Neurological: He is alert and oriented to person, place, and time.  Skin: Skin is warm and dry. Capillary refill takes less than 2 seconds.  Psychiatric: He has a normal mood and affect. His speech is normal.  Nursing note and vitals reviewed.    ED Treatments / Results  Labs (all labs ordered are listed, but only abnormal results are displayed) Labs Reviewed  LIPASE, BLOOD - Abnormal; Notable for the following components:      Result Value   Lipase 1,730 (*)    All other components within normal limits  COMPREHENSIVE METABOLIC PANEL - Abnormal; Notable for the following components:   Sodium 131 (*)    Chloride 89 (*)    Glucose, Bld 109 (*)    AST 499 (*)    ALT 79 (*)    Total Bilirubin 4.4 (*)    Anion gap 16 (*)    All other components within normal limits  CBC - Abnormal; Notable for the following components:   Platelets 47 (*)    All other components within normal limits  URINALYSIS, ROUTINE W REFLEX MICROSCOPIC  RAPID URINE DRUG SCREEN, HOSP PERFORMED  ETHANOL  HIV ANTIBODY (ROUTINE TESTING)  CBC  COMPREHENSIVE METABOLIC PANEL    EKG None  Radiology Dg Abdomen 1 View  Result Date: 10/04/2017 CLINICAL DATA:  Sharp mid abdominal pain today. EXAM: ABDOMEN - 1 VIEW COMPARISON:  None. FINDINGS: The bowel gas pattern is normal. No radio-opaque calculi or other significant radiographic abnormality are seen. IMPRESSION: Normal exam. Electronically Signed   By: Drusilla Kannerhomas  Dalessio M.D.   On: 10/04/2017 13:21   Ct Abdomen Pelvis W Contrast  Result Date: 10/04/2017 CLINICAL DATA:  Abdominal pain with distension EXAM: CT ABDOMEN AND PELVIS WITH CONTRAST TECHNIQUE: Multidetector CT imaging of the abdomen and pelvis was performed using the standard  protocol following bolus administration of intravenous contrast. CONTRAST:  100mL ISOVUE-300 IOPAMIDOL (ISOVUE-300) INJECTION 61% COMPARISON:  Ultrasound 10/04/2017, radiograph 10/04/2017, CT 07/06/2014 FINDINGS: Lower chest: Lung bases demonstrate no acute consolidation or pleural effusion. The heart size is within normal limits. Hepatobiliary: Diffuse decreased density consistent with steatosis. No focal hepatic abnormality. Tortuous gallbladder without calcified stone. No biliary dilatation. Pancreas: Moderate fluid surrounding the pancreas with indistinct appearance of the head and uncinate process. Findings are suspicious for acute pancreatitis. No hypoenhancement to suggest necrosis. There are at least 2 small fluid collections within the pancreas, 13 mm focal fluid collection at the head neck junction. 19 mm focal fluid collection at the uncinate process. No ductal dilatation. Spleen: Normal in size without focal abnormality. Adrenals/Urinary Tract: Adrenal glands are unremarkable. Kidneys are normal, without renal calculi, focal lesion, or hydronephrosis. Bladder is unremarkable. Stomach/Bowel: Stomach is within normal limits. Appendix  appears normal. Mild wall thickening of the hepatic flexure and proximal transverse colon. Vascular/Lymphatic: Mild aortic atherosclerosis. No aneurysmal dilatation. No significant adenopathy. Normal enhancement of the portal vein and splenic vein. Reproductive: Prostate is unremarkable. Other: Small amount of ascites within the pelvis. Small free fluid within the upper abdomen. 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 IMPRESSION: 1. Moderate fluid and edema diffusely around the pancreas with indistinct appearance of the head and uncinate process, consistent with acute pancreatitis. There are at least 2 small organizing fluid collections within the pancreas. 2. Mild colon wall thickening involving the hepatic flexure and transverse colon, possible  colitis versus edematous bowel secondary to liver disease. 3. Hepatic steatosis 4. Small amount of free fluid within the abdomen and pelvis. Electronically Signed   By: Jasmine Pang M.D.   On: 10/04/2017 21:57   US Abdomen Limited Ruq  Result Date: 10/04/2017 CLINICAL DATA:  Pain.  History of alcohol induced pancreatitis. EXAM: ULTRASOUND ABDOMEN LIMITED RIGHT UPPER QUADRANT COMPARISON:  None. FINDINGS: Gallbladder: No gallstones or wall thickening visualized. Gallbladder fold. No sonographic Murphy sign noted by sonographer. Common bile duct: Diameter: 2 mm Liver: No focal lesion identified. Increased parenchymal echogenicity. Portal vein is patent on color Doppler imaging with normal direction of blood flow towards the liver. IMPRESSION: 1. No acute RIGHT upper quadrant process. 2. Hepatic steatosis/hepatocellular disease. Electronically Signed   By: Awilda Metro M.D.   On: 10/04/2017 20:19    Procedures Procedures (including critical care time)  Medications Ordered in ED Medications  iopamidol (ISOVUE-300) 61 % injection (has no administration in time range)  LORazepam (ATIVAN) tablet 1 mg (has no administration in time range)    Or  LORazepam (ATIVAN) injection 1 mg (has no administration in time range)  thiamine (VITAMIN B-1) tablet 100 mg (has no administration in time range)    Or  thiamine (B-1) injection 100 mg (has no administration in time range)  folic acid (FOLVITE) tablet 1 mg (has no administration in time range)  multivitamin with minerals tablet 1 tablet (has no administration in time range)  morphine 2 MG/ML injection 2-4 mg (has no administration in time range)  acetaminophen (TYLENOL) tablet 650 mg (has no administration in time range)    Or  acetaminophen (TYLENOL) suppository 650 mg (has no administration in time range)  ondansetron (ZOFRAN) tablet 4 mg (has no administration in time range)    Or  ondansetron (ZOFRAN) injection 4 mg (has no administration in  time range)  0.9 %  sodium chloride infusion (has no administration in time range)  sodium chloride 0.9 % bolus 1,000 mL (0 mLs Intravenous Stopped 10/04/17 2144)  ondansetron (ZOFRAN) injection 4 mg (4 mg Intravenous Given 10/04/17 1858)  fentaNYL (SUBLIMAZE) injection 100 mcg (100 mcg Intravenous Given 10/04/17 1956)  LORazepam (ATIVAN) injection 1 mg (1 mg Intravenous Given 10/04/17 1955)  iopamidol (ISOVUE-300) 61 % injection 100 mL (100 mLs Intravenous Contrast Given 10/04/17 2124)  sodium chloride 0.9 % 1,000 mL with thiamine 100 mg, folic acid 1 mg, multivitamins adult 10 mL infusion ( Intravenous New Bag/Given 10/04/17 2204)     Initial Impression / Assessment and Plan / ED Course  I have reviewed the triage vital signs and the nursing notes.  Pertinent labs & imaging results that were available during my care of the patient were reviewed by me and considered in my medical decision making (see chart for details).     47 year old male who presents for evaluation of  abdominal pain, nausea/vomiting that began yesterday.  No fevers.  Has a history of alcohol abuse.  Reports his last drink was a month ago. Patient is afebrile, non-toxic appearing, sitting comfortably on examination table. Vital signs reviewed and stable.  On exam, tenderness right upper quadrant, epigastric, left upper quadrant region.  Concern for hepatobiliary etiology versus pancreatitis versus infectious etiology.  Initial labs ordered at triage.  Need for IV fluids, analgesics, antiemetics.  CBC without any significant leukocytosis.  Patient with platelets of 47.  Review of records previously low at 78 and 101.  CMP shows chloride of 89, AST of 499, ALT of 79.  Total bili is elevated at 4.4.  There is an increase from 4 months ago.  Lipase elevated at 1, 730.  U/S of RUQ shows no evidence of gallstones, CBD dilitation that would be concerning for cholecystitis or cholangitis.  Given significant elevation in LFTs and  pancreatitis, will need admission.  Discussed patient with Dr. Julian ReilGardner (hospitalist). Will accept patient for admission.   CT abdomen pelvis concerning for acute pancreatitis.  There is mention of 2 small organizing fluid collections within the pancreas.  Final Clinical Impressions(s) / ED Diagnoses   Final diagnoses:  Abdominal pain  Acute pancreatitis, unspecified complication status, unspecified pancreatitis type    ED Discharge Orders    None       Rosana HoesLayden, Alma Muegge A, PA-C 10/04/17 2240    Eber HongMiller, Brian, MD 10/06/17 1056

## 2017-10-04 NOTE — ED Notes (Signed)
Pt speaks Burmese

## 2017-10-04 NOTE — ED Notes (Signed)
Called to reassess vitals -- no answer  

## 2017-10-04 NOTE — ED Triage Notes (Signed)
Pt in c/o abdominal pain and distention since last night, no history of same, reports n/v, also constipation since yesterday

## 2017-10-04 NOTE — ED Notes (Addendum)
Pt called in the waiting room with no answer.

## 2017-10-04 NOTE — H&P (Signed)
History and Physical    Cole Lawsai Fujimoto ZOX:[id number] DOB: 06/29/1970 DOA: 10/04/2017  PCP: Julieanne MansonMulberry, Elizabeth, MD  Patient coming from: Home  I have personally briefly reviewed patient's old medical records in [facility]  Chief Complaint: Abd pain  HPI: Cole Ward is a 47 y.o. male with medical history significant of EtOH abuse, EtOH pancreatitis, transaminitis, GIB.  Patient presents to the ED for evaluation of abd pain onset last night.  Felt like stomach getting larger and having severe pain.  Primarily in epigastric area.  Several episodes of NBNB vomiting.  Last BM x3 days ago.  No fever.   ED Course: Work up c/w acute pancreatitis.  Also has AST of 499, ALT 79, either from the pancreatitis irritating the liver or EtOH hepatitis.  Bili 4.4.  Lipase 1730.  Platelets 47.  Having some tremor in ED suggestive of withdrawal symptoms.   Review of Systems: As per HPI otherwise 10 point review of systems negative.   Past Medical History:  Diagnosis Date  . Alcohol-induced pancreatitis   . Tobacco abuse   . Vision decreased     Past Surgical History:  Procedure Laterality Date  . ESOPHAGOGASTRODUODENOSCOPY N/A 07/07/2014   Procedure: ESOPHAGOGASTRODUODENOSCOPY (EGD);  Surgeon: Jeani HawkingPatrick Hung, MD;  [facility];  Service: Endoscopy;  Laterality: N/A;     reports that he has been smoking cigarettes. He has a 8.75 pack-year smoking history. He has never used smokeless tobacco. He reports that he drinks about 3.0 standard drinks of alcohol per week. He reports that he does not use drugs.  No Known Allergies  History reviewed. No pertinent family history.   Prior to Admission medications   Medication Sig Start Date End Date Taking? Authorizing Provider  chlordiazePOXIDE (LIBRIUM) 25 MG capsule 50mg  PO TID x 1D, then 25-50mg  PO BID X 1D, then 25-50mg  PO QD X 1D 05/28/17   Elson AreasSofia, Leslie K, PA-C  triamcinolone cream (KENALOG) 0.1 % Apply to affected area twice daily as needed  06/10/16   Julieanne MansonMulberry, Elizabeth, MD    Physical Exam: Vitals:   10/04/17 1250 10/04/17 1525  BP: (!) 139/110 (!) 143/112  Pulse: (!) 110 66  Resp: 20 17  Temp: 99 F (37.2 C)   TempSrc: Oral   SpO2: 100% 93%    Constitutional: NAD, calm, comfortable Eyes: PERRL, lids and conjunctivae normal ENMT: Mucous membranes are moist. Posterior pharynx clear of any exudate or lesions.Normal dentition.  Neck: normal, supple, no masses, no thyromegaly Respiratory: clear to auscultation bilaterally, no wheezing, no crackles. Normal respiratory effort. No accessory muscle use.  Cardiovascular: Regular rate and rhythm, no murmurs / rubs / gallops. No extremity edema. 2+ pedal pulses. No carotid bruits.  Abdomen: Epigastric TTP Musculoskeletal: no clubbing / cyanosis. No joint deformity upper and lower extremities. Good ROM, no contractures. Normal muscle tone.  Skin: no rashes, lesions, ulcers. No induration Neurologic: CN 2-12 grossly intact. Sensation intact, DTR normal. Strength 5/5 in all 4.  Psychiatric: Normal judgment and insight. Alert and oriented x 3. Normal mood.    Labs on Admission: I have personally reviewed following labs and imaging studies  CBC: Recent Labs  Lab 10/04/17 1304  WBC 8.5  HGB 16.7  HCT 50.0  MCV 91.6  PLT 47*   Basic Metabolic Panel: Recent Labs  Lab 10/04/17 1304  NA 131*  K 3.9  CL 89*  CO2 26  GLUCOSE 109*  BUN 9  CREATININE 0.78  CALCIUM 9.1   GFR: CrCl cannot be  calculated (Unknown ideal weight.). Liver Function Tests: Recent Labs  Lab 10/04/17 1304  AST 499*  ALT 79*  ALKPHOS 121  BILITOT 4.4*  PROT 7.5  ALBUMIN 4.0   Recent Labs  Lab 10/04/17 1304  LIPASE 1,730*   No results for input(s): AMMONIA in the last 168 hours. Coagulation Profile: No results for input(s): INR, PROTIME in the last 168 hours. Cardiac Enzymes: No results for input(s): CKTOTAL, CKMB, CKMBINDEX, TROPONINI in the last 168 hours. BNP (last 3  results) No results for input(s): PROBNP in the last 8760 hours. HbA1C: No results for input(s): HGBA1C in the last 72 hours. CBG: No results for input(s): GLUCAP in the last 168 hours. Lipid Profile: No results for input(s): CHOL, HDL, LDLCALC, TRIG, CHOLHDL, LDLDIRECT in the last 72 hours. Thyroid Function Tests: No results for input(s): TSH, T4TOTAL, FREET4, T3FREE, THYROIDAB in the last 72 hours. Anemia Panel: No results for input(s): VITAMINB12, FOLATE, FERRITIN, TIBC, IRON, RETICCTPCT in the last 72 hours. Urine analysis:    Component Value Date/Time   COLORURINE AMBER (A) 07/05/2014 2059   APPEARANCEUR CLEAR 07/05/2014 2059   LABSPEC 1.007 07/05/2014 2059   PHURINE 6.0 07/05/2014 2059   GLUCOSEU NEGATIVE 07/05/2014 2059   HGBUR NEGATIVE 07/05/2014 2059   BILIRUBINUR NEGATIVE 07/05/2014 2059   KETONESUR NEGATIVE 07/05/2014 2059   PROTEINUR NEGATIVE 07/05/2014 2059   UROBILINOGEN 1.0 07/05/2014 2059   NITRITE NEGATIVE 07/05/2014 2059   LEUKOCYTESUR NEGATIVE 07/05/2014 2059    Radiological Exams on Admission: Dg Abdomen 1 View  Result Date: 10/04/2017 CLINICAL DATA:  Sharp mid abdominal pain today. EXAM: ABDOMEN - 1 VIEW COMPARISON:  None. FINDINGS: The bowel gas pattern is normal. No radio-opaque calculi or other significant radiographic abnormality are seen. IMPRESSION: Normal exam. Electronically Signed   By: Drusilla Kannerhomas  Dalessio M.D.   On: 10/04/2017 13:21   [place] Abdomen Limited Ruq  Result Date: 10/04/2017 CLINICAL DATA:  Pain.  History of alcohol induced pancreatitis. EXAM: ULTRASOUND ABDOMEN LIMITED RIGHT UPPER QUADRANT COMPARISON:  None. FINDINGS: Gallbladder: No gallstones or wall thickening visualized. Gallbladder fold. No sonographic Murphy sign noted by sonographer. Common bile duct: Diameter: 2 mm Liver: No focal lesion identified. Increased parenchymal echogenicity. Portal vein is patent on color Doppler imaging with normal direction of blood flow towards the liver.  IMPRESSION: 1. No acute RIGHT upper quadrant process. 2. Hepatic steatosis/hepatocellular disease. Electronically Signed   By: Awilda Metroourtnay  Bloomer M.D.   On: 10/04/2017 20:19    EKG: Independently reviewed.  Assessment/Plan Principal Problem:   Acute alcoholic pancreatitis Active Problems:   Transaminitis   Alcohol abuse   Thrombocytopenia (HCC)    1. Acute alcoholic pancreatitis - 1. CT scan results pending - looks like a bunch of peri-pancreatic edema to me though. 2. But no obvious ductal stone findings on US 3. IVF 4. Morphine prn pain 5. Zofran Prn nausea 6. NPO 7. No SIRS to indicate need for ABx at this stage 8. Repeat CBC in AM 2. Transaminitis - 1. Repeat CMP in AM 3. EtOH abuse - 1. CIWA 4. Thrombocytopenia - 1. Likely due to #3 above  DVT prophylaxis: SCDs - due to thrombocytopenia Code Status: Full Family Communication: No family in room Disposition Plan: Home after admit Consults called: None Admission status: Admit to inpatient   Hillary BowGARDNER, Neri Samek M. DO [facility] Pager [phone] Only works nights!  If 7AM-7PM, please contact the primary day team physician taking care of patient  [email or web address] Password [email or web address]  10/04/2017, 9:38 PM

## 2017-10-04 NOTE — ED Provider Notes (Signed)
The patient is a 47 year old male, he has a history of alcohol pancreatitis with transaminitis, history of GI bleeding, states that he has not had alcohol in over a month however he presents to the hospital with epigastric pain, fairly severe, has a tremor on my exam and is guarding his upper abdomen but not his lower abdomen.  Heart and lung exams are unremarkable, his tachycardia has improved significantly.  He has been given IV fluids, pain medications and Ativan as I suspect he has some amount of withdrawal symptoms at this time.  His lipase is severely elevated consistent with recurrent pancreatitis however his bilirubin and his LFTs are also elevated consistent with a gallstone pancreatitis.  At this time we will go for CT scan.  Fluids Fentanyl Ativan Anticipate admission  Vitals:   10/05/17 0459 10/05/17 1259 10/05/17 2009 10/06/17 0455  BP: 97/74 110/83 114/75 120/82  Pulse: 92 75 77 75  Resp: 12 15 16 16   Temp: 99.5 F (37.5 C) 99 F (37.2 C)  98.2 F (36.8 C)  TempSrc: Oral Oral  Oral  SpO2: 98% 100% 100% 100%   Medical screening examination/treatment/procedure(s) were conducted as a shared visit with non-physician practitioner(s) and myself.  I personally evaluated the patient during the encounter.  Clinical Impression:   Final diagnoses:  Abdominal pain  Acute pancreatitis, unspecified complication status, unspecified pancreatitis type         Eber HongMiller, Jaxten Brosh, MD 10/06/17 1055

## 2017-10-05 ENCOUNTER — Other Ambulatory Visit: Payer: Self-pay

## 2017-10-05 DIAGNOSIS — R740 Nonspecific elevation of levels of transaminase and lactic acid dehydrogenase [LDH]: Secondary | ICD-10-CM

## 2017-10-05 DIAGNOSIS — F1010 Alcohol abuse, uncomplicated: Secondary | ICD-10-CM

## 2017-10-05 DIAGNOSIS — D696 Thrombocytopenia, unspecified: Secondary | ICD-10-CM

## 2017-10-05 DIAGNOSIS — K859 Acute pancreatitis without necrosis or infection, unspecified: Secondary | ICD-10-CM

## 2017-10-05 LAB — COMPREHENSIVE METABOLIC PANEL
ALBUMIN: 3.5 g/dL (ref 3.5–5.0)
ALK PHOS: 112 U/L (ref 38–126)
ALT: 60 U/L — AB (ref 0–44)
ANION GAP: 10 (ref 5–15)
AST: 296 U/L — ABNORMAL HIGH (ref 15–41)
BUN: 8 mg/dL (ref 6–20)
CALCIUM: 8.2 mg/dL — AB (ref 8.9–10.3)
CHLORIDE: 94 mmol/L — AB (ref 98–111)
CO2: 28 mmol/L (ref 22–32)
CREATININE: 0.92 mg/dL (ref 0.61–1.24)
GFR calc Af Amer: >60 mL/min (ref >60)
GFR calc non Af Amer: >60 mL/min (ref >60)
GLUCOSE: 79 mg/dL (ref 70–99)
Potassium: 3.3 mmol/L — ABNORMAL LOW (ref 3.5–5.1)
Sodium: 132 mmol/L — ABNORMAL LOW (ref 135–145)
Total Bilirubin: 4.6 mg/dL — ABNORMAL HIGH (ref 0.3–1.2)
Total Protein: 7 g/dL (ref 6.5–8.1)

## 2017-10-05 LAB — CBC
HCT: 46 % (ref 39.0–52.0)
HEMOGLOBIN: 15.4 g/dL (ref 13.0–17.0)
MCH: 30.6 pg (ref 26.0–34.0)
MCHC: 33.5 g/dL (ref 30.0–36.0)
MCV: 91.3 fL (ref 78.0–100.0)
PLATELETS: 41 10*3/uL — AB (ref 150–400)
RBC: 5.04 MIL/uL (ref 4.22–5.81)
RDW: 13.1 % (ref 11.5–15.5)
WBC: 7.5 10*3/uL (ref 4.0–10.5)

## 2017-10-05 LAB — LIPID PANEL
Cholesterol: 142 mg/dL (ref 0–200)
HDL: 44 mg/dL (ref >40)
LDL CALC: 87 mg/dL (ref 0–99)
Total CHOL/HDL Ratio: 3.2 RATIO
Triglycerides: 53 mg/dL (ref <150)
VLDL: 11 mg/dL (ref 0–40)

## 2017-10-05 LAB — ETHANOL: Alcohol, Ethyl (B): <10 mg/dL (ref <10)

## 2017-10-05 MED ORDER — POTASSIUM CHLORIDE CRYS ER 20 MEQ PO TBCR
40.0000 meq | EXTENDED_RELEASE_TABLET | Freq: Once | ORAL | Status: AC
  Administered 2017-10-05: 40 meq via ORAL
  Filled 2017-10-05: qty 2

## 2017-10-05 MED ORDER — CHLORDIAZEPOXIDE HCL 5 MG PO CAPS
10.0000 mg | ORAL_CAPSULE | Freq: Three times a day (TID) | ORAL | Status: DC
  Administered 2017-10-05 (×2): 10 mg via ORAL
  Filled 2017-10-05 (×2): qty 2

## 2017-10-05 NOTE — Progress Notes (Signed)
 @IPLOG @        PROGRESS NOTE                                                                                                                                                                                                             Patient Demographics:    Cole Ward, is a 47 y.o. male, DOB - 01/16/1971, ZOX:[id number]  Admit date - 10/04/2017   Admitting Physician Hillary BowJared M Gardner, DO  Outpatient Primary MD for the patient is Julieanne MansonMulberry, Elizabeth, MD  LOS - 1  Chief Complaint  Patient presents with  . Abdominal Pain       Brief Narrative  Cole Ward is a 47 y.o. male with medical history significant of EtOH abuse, EtOH pancreatitis, transaminitis, GIB.  Patient presents to the ED for evaluation of abd pain onset last night.  Felt like stomach getting larger and having severe pain.    Further work-up showed recurrence of alcoholic pancreatitis.   Subjective:    Cole Ward today has, No headache, No chest pain, mild epigastric abdominal pain - No Nausea, No new weakness tingling or numbness, No Cough - SOB.     Assessment  & Plan :     1.  Recurrent alcoholic pancreatitis with mild transaminitis.  Stable triglycerides and right upper quadrant ultrasound, counseled to quit alcohol, continue supportive care with bowel rest and IV fluids, continue pain control.  Specific pancreatic fluid collections likely related to some acute + chronic creatinine injury, monitor clinically.  Too soon for acute pseudocyst.  2.  Alcohol abuse.  No signs of withdrawal, placed on Librium along with CIWA protocol.  3.  Hypokalemia.  Replaced.  4.  Alcoholic cirrhosis with thrombocytopenia.  Supportive care.  Outpatient GI follow-up.    Diet :   Diet Order            Diet NPO time specified Except for: Sips with Meds  Diet effective now               Family Communication  :  None  Code Status :  Full  Disposition Plan  :  Home in 2-3 days once pancreatitis is better  Consults  :   None  Procedures  :    DVT Prophylaxis  :   SCDs   Lab Results  Component Value Date   PLT 41 (L) 10/05/2017    Inpatient Medications  Scheduled Meds: . folic acid  1 mg Oral Daily  . multivitamin with minerals  1 tablet Oral Daily  .  thiamine  100 mg Oral Daily   Continuous Infusions: . sodium chloride 125 mL/hr at 10/05/17 0713   PRN Meds:.acetaminophen **OR** acetaminophen, LORazepam **OR** LORazepam, morphine injection, ondansetron **OR** ondansetron (ZOFRAN) IV  Antibiotics  :    Anti-infectives (From admission, onward)   None         Objective:   Vitals:   10/05/17 0047 10/05/17 0047 10/05/17 0459 10/05/17 1259  BP: 124/84 124/84 97/74 110/83  Pulse: 85 85 92 75  Resp: 18 14 12 15   Temp: 98.7 F (37.1 C) 98.7 F (37.1 C) 99.5 F (37.5 C) 99 F (37.2 C)  TempSrc: Oral Oral Oral Oral  SpO2: 100% 100% 98% 100%    Wt Readings from Last 3 Encounters:  02/01/17 54.4 kg  01/31/17 54.4 kg  06/10/16 61.2 kg     Intake/Output Summary (Last 24 hours) at 10/05/2017 1424 Last data filed at 10/04/2017 2144 Gross per 24 hour  Intake 1000 ml  Output -  Net 1000 ml     Physical Exam  Awake Alert, Oriented X 3, No new F.N deficits, Normal affect [place].AT,PERRAL Supple Neck,No JVD, No cervical lymphadenopathy appriciated.  Symmetrical Chest wall movement, Good air movement bilaterally, CTAB RRR,No Gallops,Rubs or new Murmurs, No Parasternal Heave +ve B.Sounds, Abd Soft, No tenderness, No organomegaly appriciated, No rebound - guarding or rigidity. No Cyanosis, Clubbing or edema, No new Rash or bruise      Data Review:    CBC Recent Labs  Lab 10/04/17 1304 10/05/17 0206  WBC 8.5 7.5  HGB 16.7 15.4  HCT 50.0 46.0  PLT 47* 41*  MCV 91.6 91.3  MCH 30.6 30.6  MCHC 33.4 33.5  RDW 13.2 13.1    Chemistries  Recent Labs  Lab 10/04/17 1304 10/05/17 0206  NA 131* 132*  K 3.9 3.3*  CL 89* 94*  CO2 26 28  GLUCOSE 109* 79  BUN 9 8  CREATININE  0.78 0.92  CALCIUM 9.1 8.2*  AST 499* 296*  ALT 79* 60*  ALKPHOS 121 112  BILITOT 4.4* 4.6*   ------------------------------------------------------------------------------------------------------------------ Recent Labs    10/05/17 0756  CHOL 142  HDL 44  LDLCALC 87  TRIG 53  CHOLHDL 3.2    No results found for: HGBA1C ------------------------------------------------------------------------------------------------------------------ No results for input(s): TSH, T4TOTAL, T3FREE, THYROIDAB in the last 72 hours.  Invalid input(s): FREET3 ------------------------------------------------------------------------------------------------------------------ No results for input(s): VITAMINB12, FOLATE, FERRITIN, TIBC, IRON, RETICCTPCT in the last 72 hours.  Coagulation profile No results for input(s): INR, PROTIME in the last 168 hours.  No results for input(s): DDIMER in the last 72 hours.  Cardiac Enzymes No results for input(s): CKMB, TROPONINI, MYOGLOBIN in the last 168 hours.  Invalid input(s): CK ------------------------------------------------------------------------------------------------------------------ No results found for: BNP  Micro Results No results found for this or any previous visit (from the past 240 hour(s)).  Radiology Reports Dg Abdomen 1 View  Result Date: 10/04/2017 CLINICAL DATA:  Lambert Mody mid abdominal pain today. EXAM: ABDOMEN - 1 VIEW COMPARISON:  None. FINDINGS: The bowel gas pattern is normal. No radio-opaque calculi or other significant radiographic abnormality are seen. IMPRESSION: Normal exam. Electronically Signed   By: Drusilla Kanner M.D.   On: 10/04/2017 13:21   Ct Abdomen Pelvis W Contrast  Result Date: 10/04/2017 CLINICAL DATA:  Abdominal pain with distension EXAM: CT ABDOMEN AND PELVIS WITH CONTRAST TECHNIQUE: Multidetector CT imaging of the abdomen and pelvis was performed using the standard protocol following bolus administration of  intravenous contrast. CONTRAST:  ISOVUE-300  IOPAMIDOL (ISOVUE-300) INJECTION 61% COMPARISON:  Ultrasound 10/04/2017, radiograph 10/04/2017, CT 07/06/2014 FINDINGS: Lower chest: Lung bases demonstrate no acute consolidation or pleural effusion. The heart size is within normal limits. Hepatobiliary: Diffuse decreased density consistent with steatosis. No focal hepatic abnormality. Tortuous gallbladder without calcified stone. No biliary dilatation. Pancreas: Moderate fluid surrounding the pancreas with indistinct appearance of the head and uncinate process. Findings are suspicious for acute pancreatitis. No hypoenhancement to suggest necrosis. There are at least 2 small fluid collections within the pancreas, 13 mm focal fluid collection at the head neck junction. 19 mm focal fluid collection at the uncinate process. No ductal dilatation. Spleen: Normal in size without focal abnormality. Adrenals/Urinary Tract: Adrenal glands are unremarkable. Kidneys are normal, without renal calculi, focal lesion, or hydronephrosis. Bladder is unremarkable. Stomach/Bowel: Stomach is within normal limits. Appendix appears normal. Mild wall thickening of the hepatic flexure and proximal transverse colon. Vascular/Lymphatic: Mild aortic atherosclerosis. No aneurysmal dilatation. No significant adenopathy. Normal enhancement of the portal vein and splenic vein. Reproductive: Prostate is unremarkable. Other: Small amount of ascites within the pelvis. Small free fluid within the upper abdomen. 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 IMPRESSION: 1. Moderate fluid and edema diffusely around the pancreas with indistinct appearance of the head and uncinate process, consistent with acute pancreatitis. There are at least 2 small organizing fluid collections within the pancreas. 2. Mild colon wall thickening involving the hepatic flexure and transverse colon, possible colitis versus edematous bowel secondary to  liver disease. 3. Hepatic steatosis 4. Small amount of free fluid within the abdomen and pelvis. Electronically Signed   By: Jasmine PangKim  Fujinaga M.D.   On: 10/04/2017 21:57   [place] Abdomen Limited Ruq  Result Date: 10/04/2017 CLINICAL DATA:  Pain.  History of alcohol induced pancreatitis. EXAM: ULTRASOUND ABDOMEN LIMITED RIGHT UPPER QUADRANT COMPARISON:  None. FINDINGS: Gallbladder: No gallstones or wall thickening visualized. Gallbladder fold. No sonographic Murphy sign noted by sonographer. Common bile duct: Diameter: 2 mm Liver: No focal lesion identified. Increased parenchymal echogenicity. Portal vein is patent on color Doppler imaging with normal direction of blood flow towards the liver. IMPRESSION: 1. No acute RIGHT upper quadrant process. 2. Hepatic steatosis/hepatocellular disease. Electronically Signed   By: Awilda Metroourtnay  Bloomer M.D.   On: 10/04/2017 20:19    Time Spent in minutes  30   Susa RaringPrashant Efren Kross M.D on 10/05/2017 at 2:24 PM  To page go to [email or web address] - password [email or web address]

## 2017-10-05 NOTE — Progress Notes (Signed)
Pt admitted to the unit. Pt is stable, alert and oriented per baseline. Oriented to room, staff, and call bell. Educated to call for any assistance. Bed in lowest position, call bell within reach- will continue to monitor. Understands some english.

## 2017-10-05 NOTE — Plan of Care (Signed)
°  Problem: Coping: °Goal: Level of anxiety will decrease °Outcome: Progressing °  °

## 2017-10-06 LAB — COMPREHENSIVE METABOLIC PANEL
ALBUMIN: 3.1 g/dL — AB (ref 3.5–5.0)
ALK PHOS: 107 U/L (ref 38–126)
ALT: 43 U/L (ref 0–44)
AST: 217 U/L — AB (ref 15–41)
Anion gap: 10 (ref 5–15)
BILIRUBIN TOTAL: 4 mg/dL — AB (ref 0.3–1.2)
BUN: 5 mg/dL — AB (ref 6–20)
CALCIUM: 7.6 mg/dL — AB (ref 8.9–10.3)
CO2: 23 mmol/L (ref 22–32)
Chloride: 99 mmol/L (ref 98–111)
Creatinine, Ser: 0.64 mg/dL (ref 0.61–1.24)
GFR calc Af Amer: >60 mL/min (ref >60)
GFR calc non Af Amer: >60 mL/min (ref >60)
GLUCOSE: 55 mg/dL — AB (ref 70–99)
POTASSIUM: 3 mmol/L — AB (ref 3.5–5.1)
Sodium: 132 mmol/L — ABNORMAL LOW (ref 135–145)
Total Protein: 6.1 g/dL — ABNORMAL LOW (ref 6.5–8.1)

## 2017-10-06 LAB — LIPASE, BLOOD: Lipase: 325 U/L — ABNORMAL HIGH (ref 11–51)

## 2017-10-06 LAB — PROTIME-INR
INR: 1.25
Prothrombin Time: 15.6 seconds — ABNORMAL HIGH (ref 11.4–15.2)

## 2017-10-06 LAB — MAGNESIUM: Magnesium: 1.3 mg/dL — ABNORMAL LOW (ref 1.7–2.4)

## 2017-10-06 MED ORDER — MAGNESIUM SULFATE 4 GM/100ML IV SOLN
4.0000 g | Freq: Once | INTRAVENOUS | Status: DC
  Filled 2017-10-06: qty 100

## 2017-10-06 MED ORDER — POTASSIUM CHLORIDE CRYS ER 20 MEQ PO TBCR: 40.0000 meq | EXTENDED_RELEASE_TABLET | ORAL | Status: DC

## 2017-10-06 NOTE — Progress Notes (Signed)
Pt has been walking on the hallway without an IV; Pt does not speak much english ([place] by nationality) & made a sign language that he wants to eat;Pt's diet order is NPO so I encouraged him to go back to his room and wait for the IV team to place a new IV on him coz he's getting NS at 125 continuous;when IV team came he's already `gone from his room;doc on call & security were notified of this event.

## 2017-10-06 NOTE — Progress Notes (Signed)
[name];one of the pt.'s contact info has been called and notified that the pt left the hospital without discharge orders.

## 2017-10-06 NOTE — Progress Notes (Signed)
According to night RN pt has been out of room since 6am and has not been seen since. Security informed and has not found pt. AC made aware and stated to take pt out of system. Day MD also made aware. No pt belongings in room.

## 2017-10-19 LAB — HIV ANTIBODY (ROUTINE TESTING W REFLEX): HIV Screen 4th Generation wRfx: NONREACTIVE

## 2017-11-01 NOTE — Discharge Summary (Signed)
AMA  Patient left AMA 6 am from the hospital.   Susa Raring M.D on 10/06/17  [facility]  Time < 30 minutes  Last Note Below                                               Patient Demographics:    Cole Ward, is a 47 y.o. male, DOB - 01/16/1971, ZOX:[id number]  Admit date - 10/04/2017   Admitting Physician Hillary Bow, DO  Outpatient Primary MD for the patient is Julieanne Manson, MD  LOS - 1     Chief Complaint  Patient presents with  . Abdominal Pain       Brief Narrative  Cole Ward a 47 y.o.malewith medical history significant ofEtOH abuse, EtOH pancreatitis, transaminitis, GIB. Patient presents to the ED for evaluation of abd pain onset last night. Felt like stomach getting larger and having severe pain.   Further work-up showed recurrence of alcoholic pancreatitis.   Subjective:    Cole Ward today has, No headache, No chest pain, mild epigastric abdominal pain - No Nausea, No new weakness tingling or numbness, No Cough - SOB.     Assessment  & Plan :     1.  Recurrent alcoholic pancreatitis with mild transaminitis.  Stable triglycerides and right upper quadrant ultrasound, counseled to quit alcohol, continue supportive care with bowel rest and IV fluids, continue pain control.  Specific pancreatic fluid collections likely related to some acute + chronic creatinine injury, monitor clinically.  Too soon for acute pseudocyst.  2.  Alcohol abuse.  No signs of withdrawal, placed on Librium along with CIWA protocol.  3.  Hypokalemia.  Replaced.  4.  Alcoholic cirrhosis with thrombocytopenia.  Supportive care.  Outpatient GI follow-up.    Diet :      Diet Order                  Diet NPO time specified Except for: Sips with Meds  Diet effective now                Family  Communication  :  None  Code Status :  Full  Disposition Plan  :  Home in 2-3 days once pancreatitis is better  Consults  :  None  Procedures  :    DVT Prophylaxis  :   SCDs   RecentLabs       Lab Results  Component Value Date   PLT 41 (L) 10/05/2017      Inpatient Medications  Scheduled Meds: . folic acid  1 mg Oral Daily  . multivitamin with minerals  1 tablet Oral Daily  . thiamine  100 mg Oral Daily   Continuous Infusions: . sodium chloride 125 mL/hr at 10/05/17 0713   PRN Meds:.acetaminophen **OR** acetaminophen, LORazepam **OR** LORazepam, morphine injection, ondansetron **OR** ondansetron (ZOFRAN) IV  Antibiotics  :       Anti-infectives (From admission, onward)   None         Objective:  Vitals:   10/05/17 0047 10/05/17 0047 10/05/17 0459 10/05/17 1259  BP: 124/84 124/84 97/74 110/83  Pulse: 85 85 92 75  Resp: 18 14 12 15   Temp: 98.7 F (37.1 C) 98.7 F (37.1 C) 99.5 F (37.5 C) 99 F (37.2 C)  TempSrc: Oral Oral Oral Oral  SpO2: 100% 100% 98% 100%       Wt Readings from Last 3 Encounters:  02/01/17 54.4 kg  01/31/17 54.4 kg  06/10/16 61.2 kg     Intake/Output Summary (Last 24 hours) at 10/05/2017 1424 Last data filed at 10/04/2017 2144    Gross per 24 hour  Intake 1000 ml  Output -  Net 1000 ml     Physical Exam  Awake Alert, Oriented X 3, No new F.N deficits, Normal affect Cole Ward,Cole Ward,No JVD, No cervical lymphadenopathy appriciated.  Symmetrical Chest wall movement, Good air movement bilaterally, CTAB RRR,No Gallops,Rubs or new Murmurs, No Parasternal Heave +ve B.Sounds, Abd Soft, No tenderness, No organomegaly appriciated, No rebound - guarding or rigidity. No Cyanosis, Clubbing or edema, No new Rash or bruise      Data Review:    CBC LastLabs      Recent Labs  Lab 10/04/17 1304 10/05/17 0206  WBC 8.5 7.5  HGB 16.7 15.4  HCT 50.0 46.0  PLT 47* 41*  MCV  91.6 91.3  MCH 30.6 30.6  MCHC 33.4 33.5  RDW 13.2 13.1      Chemistries  LastLabs  Recent Labs  Lab 10/04/17 1304 10/05/17 0206  NA 131* 132*  K 3.9 3.3*  CL 89* 94*  CO2 26 28  GLUCOSE 109* 79  BUN 9 8  CREATININE 0.78 0.92  CALCIUM 9.1 8.2*  AST 499* 296*  ALT 79* 60*  ALKPHOS 121 112  BILITOT 4.4* 4.6*     ------------------------------------------------------------------------------------------------------------------ RecentLabs(last2labs)  Recent Labs    10/05/17 0756  CHOL 142  HDL 44  LDLCALC 87  TRIG 53  CHOLHDL 3.2      RecentLabs  No results found for: HGBA1C   ------------------------------------------------------------------------------------------------------------------  RecentLabs(last2labs)  No results for input(s): TSH, T4TOTAL, T3FREE, THYROIDAB in the last 72 hours.  Invalid input(s): FREET3   ------------------------------------------------------------------------------------------------------------------ RecentLabs(last2labs)  No results for input(s): VITAMINB12, FOLATE, FERRITIN, TIBC, IRON, RETICCTPCT in the last 72 hours.    Coagulation profile LastLabs  No results for input(s): INR, PROTIME in the last 168 hours.    RecentLabs(last2labs)  No results for input(s): DDIMER in the last 72 hours.    Cardiac Enzymes  LastLabs  No results for input(s): CKMB, TROPONINI, MYOGLOBIN in the last 168 hours.  Invalid input(s): CK   ------------------------------------------------------------------------------------------------------------------ Labs(Brief)  No results found for: BNP    Micro Results No results found for this or any previous visit (from the past 240 hour(s)).  Radiology Reports  ImagingResults  Dg Abdomen 1 View  Result Date: 10/04/2017 CLINICAL DATA:  Lambert Mody mid abdominal pain today. EXAM: ABDOMEN - 1 VIEW COMPARISON:  None. FINDINGS: The bowel gas pattern is  normal. No radio-opaque calculi or other significant radiographic abnormality are seen. IMPRESSION: Normal exam. Electronically Signed   By: Drusilla Kanner M.D.   On: 10/04/2017 13:21   Ct Abdomen Pelvis W Contrast  Result Date: 10/04/2017 CLINICAL DATA:  Abdominal pain with distension EXAM: CT ABDOMEN AND PELVIS WITH CONTRAST TECHNIQUE: Multidetector CT imaging of the abdomen and pelvis was performed using the standard protocol following bolus administration of intravenous contrast. CONTRAST:  ISOVUE-300 IOPAMIDOL (ISOVUE-300) INJECTION 61%  COMPARISON:  Ultrasound 10/04/2017, radiograph 10/04/2017, CT 07/06/2014 FINDINGS: Lower chest: Lung bases demonstrate no acute consolidation or pleural effusion. The heart size is within normal limits. Hepatobiliary: Diffuse decreased density consistent with steatosis. No focal hepatic abnormality. Tortuous gallbladder without calcified stone. No biliary dilatation. Pancreas: Moderate fluid surrounding the pancreas with indistinct appearance of the head and uncinate process. Findings are suspicious for acute pancreatitis. No hypoenhancement to suggest necrosis. There are at least 2 small fluid collections within the pancreas, 13 mm focal fluid collection at the head Ward junction. 19 mm focal fluid collection at the uncinate process. No ductal dilatation. Spleen: Normal in size without focal abnormality. Adrenals/Urinary Tract: Adrenal glands are unremarkable. Kidneys are normal, without renal calculi, focal lesion, or hydronephrosis. Bladder is unremarkable. Stomach/Bowel: Stomach is within normal limits. Appendix appears normal. Mild wall thickening of the hepatic flexure and proximal transverse colon. Vascular/Lymphatic: Mild aortic atherosclerosis. No aneurysmal dilatation. No significant adenopathy. Normal enhancement of the portal vein and splenic vein. Reproductive: Prostate is unremarkable. Other: Small amount of ascites within the pelvis. Small free fluid  within the upper abdomen. 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 IMPRESSION: 1. Moderate fluid and edema diffusely around the pancreas with indistinct appearance of the head and uncinate process, consistent with acute pancreatitis. There are at least 2 small organizing fluid collections within the pancreas. 2. Mild colon wall thickening involving the hepatic flexure and transverse colon, possible colitis versus edematous bowel secondary to liver disease. 3. Hepatic steatosis 4. Small amount of free fluid within the abdomen and pelvis. Electronically Signed   By: Jasmine PangKim  Fujinaga M.D.   On: 10/04/2017 21:57   [place] Abdomen Limited Ruq  Result Date: 10/04/2017 CLINICAL DATA:  Pain.  History of alcohol induced pancreatitis. EXAM: ULTRASOUND ABDOMEN LIMITED RIGHT UPPER QUADRANT COMPARISON:  None. FINDINGS: Gallbladder: No gallstones or wall thickening visualized. Gallbladder fold. No sonographic Murphy sign noted by sonographer. Common bile duct: Diameter: 2 mm Liver: No focal lesion identified. Increased parenchymal echogenicity. Portal vein is patent on color Doppler imaging with normal direction of blood flow towards the liver. IMPRESSION: 1. No acute RIGHT upper quadrant process. 2. Hepatic steatosis/hepatocellular disease. Electronically Signed   By: Awilda Metroourtnay  Bloomer M.D.   On: 10/04/2017 20:19     Time Spent in minutes  30   Susa RaringPrashant Singh M.D on 10/05/2017 at 2:24 PM  To page go to [email or web address] - password [email or web address]

## 2018-05-15 ENCOUNTER — Other Ambulatory Visit: Payer: Self-pay

## 2018-05-15 ENCOUNTER — Inpatient Hospital Stay (HOSPITAL_COMMUNITY)
Admission: EM | Admit: 2018-05-15 | Discharge: 2018-05-17 | DRG: 439 | Discharge status: Against Medical Advice | Payer: Self-pay | Attending: Internal Medicine | Admitting: Internal Medicine

## 2018-05-15 ENCOUNTER — Encounter (HOSPITAL_COMMUNITY): Payer: Self-pay | Admitting: Emergency Medicine

## 2018-05-15 DIAGNOSIS — K859 Acute pancreatitis without necrosis or infection, unspecified: Principal | ICD-10-CM | POA: Diagnosis present

## 2018-05-15 DIAGNOSIS — D696 Thrombocytopenia, unspecified: Secondary | ICD-10-CM | POA: Diagnosis present

## 2018-05-15 DIAGNOSIS — R1013 Epigastric pain: Secondary | ICD-10-CM

## 2018-05-15 DIAGNOSIS — R519 Headache, unspecified: Secondary | ICD-10-CM

## 2018-05-15 DIAGNOSIS — E876 Hypokalemia: Secondary | ICD-10-CM | POA: Diagnosis present

## 2018-05-15 DIAGNOSIS — K863 Pseudocyst of pancreas: Secondary | ICD-10-CM | POA: Diagnosis present

## 2018-05-15 DIAGNOSIS — E871 Hypo-osmolality and hyponatremia: Secondary | ICD-10-CM | POA: Diagnosis present

## 2018-05-15 DIAGNOSIS — F17210 Nicotine dependence, cigarettes, uncomplicated: Secondary | ICD-10-CM | POA: Diagnosis present

## 2018-05-15 DIAGNOSIS — F1010 Alcohol abuse, uncomplicated: Secondary | ICD-10-CM | POA: Diagnosis present

## 2018-05-15 DIAGNOSIS — R51 Headache: Secondary | ICD-10-CM | POA: Diagnosis present

## 2018-05-15 MED ORDER — SODIUM CHLORIDE 0.9 % IV BOLUS
500.0000 mL | Freq: Once | INTRAVENOUS | Status: AC
  Administered 2018-05-16: 500 mL via INTRAVENOUS

## 2018-05-15 MED ORDER — IBUPROFEN 400 MG PO TABS
400.0000 mg | ORAL_TABLET | Freq: Once | ORAL | Status: AC
  Administered 2018-05-16: 400 mg via ORAL
  Filled 2018-05-15: qty 1

## 2018-05-15 NOTE — ED Triage Notes (Addendum)
Pt BIB EMS c/o Lower abdominal pain starting yesterday. Pt. Denies NVD. Pt states he feels constipated. Pt last Bowel movement was 3 days ago.  Pt. Also c/o occipital pain with dizziness.

## 2018-05-15 NOTE — ED Provider Notes (Signed)
[facility] EMERGENCY DEPARTMENT Provider Note   CSN: [id number] Arrival date & time: 05/15/18  2251    History   Chief Complaint Chief Complaint  Patient presents with  . Abdominal Pain   The history is provided by the patient. The history is limited by a language barrier. A language interpreter was used.    HPI Cole Ward is a 48 y.o. male with PMHx alcohol induce pancreatitis, transaminitis, previous GI bleed, and thrombocytopenia who presents to the ED complaining of gradual onset, constant, lower abdominal pain x 1 day. He also endorses constipation - states his last BM was yesterday. He typically has 2-3 BMs per day which has him concerned. No hematochezia or melena. Denies nausea, vomiting, fever, chills, urinary symptoms, testicular pain or swelling, or penile discharge. States he last drank EtOH 1 month ago.   Pt also complaining of occipital headache and lightheadedness x 1 day. No syncope. He reports hx of headaches in the past and states this feels similar. Denies visual changes, unilateral weakness or numbness. Reports he has not taken anything for either of his symptoms because he did not know what medication to take which is why he came to the ED tonight.        Past Medical History:  Diagnosis Date  . Alcohol-induced pancreatitis   . Tobacco abuse   . Vision decreased     Patient Active Problem List   Diagnosis Date Noted  . Acute pancreatitis 05/16/2018  . Thrombocytopenia (HCC) 10/04/2017  . Acute alcoholic pancreatitis 10/04/2017  . Colitis 07/12/2014  . Acute esophagitis 07/12/2014  . Abdominal pain 07/06/2014  . Elevated liver enzymes 07/06/2014  . Transaminitis 07/06/2014  . GI bleed 07/06/2014  . Alcohol abuse 07/06/2014  . Tobacco abuse     Past Surgical History:  Procedure Laterality Date  . ESOPHAGOGASTRODUODENOSCOPY N/A 07/07/2014   Procedure: ESOPHAGOGASTRODUODENOSCOPY (EGD);  Surgeon: Jeani Hawking, MD;  Location: [facility]  ENDOSCOPY;  Service: Endoscopy;  Laterality: N/A;        Home Medications    Prior to Admission medications   Medication Sig Start Date End Date Taking? Authorizing Provider  chlordiazePOXIDE (LIBRIUM) 25 MG capsule  PO TID x 1D, then 25-50mg  PO BID X 1D, then 25-50mg  PO QD X 1D Patient not taking: Reported on 10/04/2017 05/28/17   Elson Areas, PA-C  Multiple Vitamin (MULTIVITAMIN WITH MINERALS) TABS tablet Take 1 tablet by mouth daily.    [provider]  triamcinolone cream (KENALOG) 0.1 % Apply to affected area twice daily as needed Patient not taking: Reported on 10/04/2017 06/10/16   Julieanne Manson, MD    Family History No family history on file.  Social History Social History   Tobacco Use  . Smoking status: Current Every Day Smoker    Packs/day: 0.25    Years: 35.00    Pack years: 8.75    Types: Cigarettes  . Smokeless tobacco: Never Used  Substance Use Topics  . Alcohol use: Yes    Alcohol/week: 3.0 standard drinks    Types: 3 Shots of liquor per week  . Drug use: No     Allergies   Patient has no known allergies.   Review of Systems Review of Systems  Constitutional: Negative for chills and fever.  HENT: Positive for ear pain and sore throat.   Eyes: Negative for visual disturbance.  Respiratory: Negative for cough.   Cardiovascular: Negative for chest pain.  Gastrointestinal: Positive for abdominal pain and constipation. Negative for blood  in stool, nausea and vomiting.  Genitourinary: Negative for dysuria, frequency, penile pain and testicular pain.  Musculoskeletal: Positive for neck stiffness.  Skin: Positive for rash.  Neurological: Positive for light-headedness and headaches. Negative for syncope.     Physical Exam Updated Vital Signs BP (!) 110/93   Pulse 80   Temp (!) 100.7 F (38.2 C) (Rectal)   Resp (!) 23   SpO2 100%   Physical Exam Vitals signs and nursing note reviewed.  Constitutional:      Appearance: He is  not ill-appearing.  HENT:     Head: Normocephalic and atraumatic.  Eyes:     General: No scleral icterus.    Extraocular Movements: Extraocular movements intact.     Conjunctiva/sclera: Conjunctivae normal.     Pupils: Pupils are equal, round, and reactive to light.  Neck:     Musculoskeletal: Neck supple.  Cardiovascular:     Rate and Rhythm: Normal rate and regular rhythm.  Pulmonary:     Effort: Pulmonary effort is normal.     Breath sounds: Normal breath sounds.  Abdominal:     General: Abdomen is flat. Bowel sounds are normal. There is no distension.     Palpations: Abdomen is soft.     Tenderness: There is abdominal tenderness in the right lower quadrant and epigastric area. There is no guarding or rebound. Negative signs include Rovsing's sign, McBurney's sign, psoas sign and obturator sign.     Comments: Pt endorses pain to lower abdomen although not exquisitely tender on palpation. Points to RLQ and epigastrium with palpation to indicate that's where his pain is at. No rebound or guarding.   Skin:    General: Skin is warm and dry.  Neurological:     Mental Status: He is alert and oriented to person, place, and time.     Cranial Nerves: Cranial nerves are intact. No facial asymmetry.     Sensory: Sensation is intact.     Motor: No weakness.     Comments: Strength 5/5 in upper and lower extremities bilaterally.       ED Treatments / Results  Labs (all labs ordered are listed, but only abnormal results are displayed) Labs Reviewed  COMPREHENSIVE METABOLIC PANEL - Abnormal; Notable for the following components:      Result Value   Sodium 130 (*)    Chloride 89 (*)    Glucose, Bld 111 (*)    Total Protein 8.3 (*)    AST 258 (*)    Total Bilirubin 3.3 (*)    All other components within normal limits  LIPASE, BLOOD - Abnormal; Notable for the following components:   Lipase 1,552 (*)    All other components within normal limits  CBC WITH DIFFERENTIAL/PLATELET -  Abnormal; Notable for the following components:   Platelets 35 (*)    All other components within normal limits  TROPONIN I  ETHANOL  URINALYSIS, ROUTINE W REFLEX MICROSCOPIC    EKG EKG Interpretation  Date/Time:  Sunday May 15 2018 23:56:57 EDT Ventricular Rate:  84 PR Interval:    QRS Duration: 90 QT Interval:  390 QTC Calculation: 461 R Axis:   74 Text Interpretation:  Sinus tachycardia Paired ventricular premature complexes ST elev, probable normal early repol pattern When compared with ECG of 07/05/2014, No significant change was found Confirmed by Dione Booze ([id number]) on 05/16/2018 12:01:32 AM   Radiology Ct Abdomen Pelvis W Contrast  Result Date: 05/16/2018 CLINICAL DATA:  Left abdominal pain with feelings of  constipation. EXAM: CT ABDOMEN AND PELVIS WITH CONTRAST TECHNIQUE: Multidetector CT imaging of the abdomen and pelvis was performed using the standard protocol following bolus administration of intravenous contrast. CONTRAST:  OMNIPAQUE IOHEXOL 300 MG/ML  SOLN COMPARISON:  10/04/2017 FINDINGS: Lower chest: No acute abnormality. Hepatobiliary: There is diffuse hepatic steatosis. No focal liver abnormality. Gallbladder appears distended. No gallbladder wall thickening or pericholecystic fluid. No biliary ductal dilatation. Pancreas: There is diffuse edema. No main duct dilatation. Cystic structure within posterior head of pancreas measures 2.8 cm, image 32/3. Previously 1.7 cm. Within the neck of pancreas there is a 2 cm structure which is slightly hypodense to adjacent pancreas measuring 2 cm, image 29/3. This may represent a complicated pseudo cyst, focal area of necrosis or less favored a small mass. Spleen: Normal in size without focal abnormality. Adrenals/Urinary Tract: Normal adrenal glands. Normal appearance of the kidneys. The urinary bladder is unremarkable. Stomach/Bowel: Stomach is normal. The small bowel loops are unremarkable. Normal appearance of the pancreas.  No abnormal dilatation of the colon. Vascular/Lymphatic: Aortic atherosclerosis. No aneurysm. The portal vein and intrahepatic veins remain patent. Splenic vein is patent. No abdominopelvic adenopathy. Reproductive: Prostate is unremarkable. Other: There is a small volume of ascites within the abdomen and pelvis. No extra pancreatic fluid collections identified. Musculoskeletal: No acute or significant osseous findings. IMPRESSION: 1. Imaging findings compatible with acute pancreatitis. 2. Cystic structure within posterior head of pancreas measuring 2.8 cm is identified. In the current clinical setting findings likely represent a pseudocyst. 3. Indeterminate structure which is slightly hypodense to adjacent pancreas is noted within neck of pancreas measuring 2 cm. This is indeterminate and is in an area of previous pseudo cyst. This may represent an area of pancreatic necrosis, complex pseudo cyst or, less favored, a small mass. Advise follow-up imaging after resolution of this acute episode to ensure resolution. 4. Small volume of ascites within the abdomen and pelvis. 5. Hepatic steatosis. Electronically Signed   By: Signa Kell M.D.   On: 05/16/2018 02:16   Dg Chest Port 1 View  Result Date: 05/16/2018 CLINICAL DATA:  48 year old male with lower abdominal pain and fever. EXAM: PORTABLE CHEST 1 VIEW COMPARISON:  Chest radiograph dated 03/06/2015 FINDINGS: The heart size and mediastinal contours are within normal limits. Both lungs are clear. The visualized skeletal structures are unremarkable. IMPRESSION: No active disease. Electronically Signed   By: Elgie Collard M.D.   On: 05/16/2018 00:19    Procedures Procedures (including critical care time)  Medications Ordered in ED Medications  sodium chloride 0.9 % bolus 500 mL (0 mLs Intravenous Stopped 05/16/18 0033)  ibuprofen (ADVIL,MOTRIN) tablet 400 mg (400 mg Oral Given 05/16/18 0012)  iohexol (OMNIPAQUE) 300 MG/ML solution 100 mL (100 mLs  Intravenous Contrast Given 05/16/18 0135)  morphine 4 MG/ML injection 4 mg (4 mg Intravenous Given 05/16/18 0234)  ibuprofen (ADVIL,MOTRIN) tablet 200 mg (200 mg Oral Given 05/16/18 0258)     Initial Impression / Assessment and Plan / ED Course  I have reviewed the triage vital signs and the nursing notes.  Pertinent labs & imaging results that were available during my care of the patient were reviewed by me and considered in my medical decision making (see chart for details).    Pt presents with lower abdominal pain and constipation x 1 day as well as headache and lightheadedness. History limited even with interpretor services. Given hx of pancreatitis will get baseline labs including CBC, CMP, lipase, EtOH, U/A. Will get CT A/P  as well given rectal temp 100.7. Has hx of headaches; reports this feels similar. Neuro exam unremarkable. Low suspicion for intracranial bleed given presentation, normal neuro exam, no nausea/vomiting, syncope. EKG and troponin ordered given lightheadedness. Will give 500 CC bolus and reevaluate headache. CXR ordered for epigastric pain; no acute abnormalities; do not suspect COVID.   2:36 AM CBC without leukocytosis or anemia. Platelets low at 35; pt has hx of thrombocytopenia. CMP with 130 Na; 500 CC bolus already given. AST elevated at 258; total bili elevated at 3.3; these appear to be pt's baseline. Lipase >1500, most recent lipase 7 months ago at 325. CT scan shows acute pancreatitis with indeterminate structure likely either pseudocyst or area of necrosis. Given acute pancreatitis and area of possible necrosis/pseudocyst with pt being febrile will admit to hospitalist services. 4 mg Morphine ordered for pain. Dr. Preston Fleeting attending physician evaluated pt as well and is in agreement with admission at this time.   3:14 AM Dierdre Forth, PA-C discussed case with [facility] who will admit pt for further eval and pain management.        Final Clinical  Impressions(s) / ED Diagnoses   Final diagnoses:  Epigastric pain  Acute pancreatitis, unspecified complication status, unspecified pancreatitis type    ED Discharge Orders    None       Tanda Rockers, PA-C 05/16/18 6015    Dione Booze, MD 05/16/18 [phone]

## 2018-05-16 ENCOUNTER — Emergency Department (HOSPITAL_COMMUNITY): Payer: Self-pay

## 2018-05-16 DIAGNOSIS — R519 Headache, unspecified: Secondary | ICD-10-CM

## 2018-05-16 DIAGNOSIS — R51 Headache: Secondary | ICD-10-CM

## 2018-05-16 DIAGNOSIS — K859 Acute pancreatitis without necrosis or infection, unspecified: Principal | ICD-10-CM

## 2018-05-16 DIAGNOSIS — K863 Pseudocyst of pancreas: Secondary | ICD-10-CM

## 2018-05-16 DIAGNOSIS — E871 Hypo-osmolality and hyponatremia: Secondary | ICD-10-CM

## 2018-05-16 LAB — URINALYSIS, ROUTINE W REFLEX MICROSCOPIC
Bacteria, UA: NONE SEEN
Bilirubin Urine: NEGATIVE
Glucose, UA: NEGATIVE mg/dL
Ketones, ur: 5 mg/dL — AB
Leukocytes,Ua: NEGATIVE
Nitrite: NEGATIVE
Protein, ur: 30 mg/dL — AB
Specific Gravity, Urine: >1.046 — ABNORMAL HIGH (ref 1.005–1.030)
pH: 7 (ref 5.0–8.0)

## 2018-05-16 LAB — CBC WITH DIFFERENTIAL/PLATELET
Abs Immature Granulocytes: 0.04 10*3/uL (ref 0.00–0.07)
BASOS ABS: 0 10*3/uL (ref 0.0–0.1)
Basophils Relative: 0 %
Eosinophils Absolute: 0.2 10*3/uL (ref 0.0–0.5)
Eosinophils Relative: 3 %
HCT: 46.7 % (ref 39.0–52.0)
Hemoglobin: 15.9 g/dL (ref 13.0–17.0)
Immature Granulocytes: 1 %
Lymphocytes Relative: 15 %
Lymphs Abs: 0.9 10*3/uL (ref 0.7–4.0)
MCH: 31.2 pg (ref 26.0–34.0)
MCHC: 34 g/dL (ref 30.0–36.0)
MCV: 91.7 fL (ref 80.0–100.0)
Monocytes Absolute: 0.6 10*3/uL (ref 0.1–1.0)
Monocytes Relative: 10 %
NRBC: 0 % (ref 0.0–0.2)
Neutro Abs: 4.4 10*3/uL (ref 1.7–7.7)
Neutrophils Relative %: 71 %
Platelets: 35 10*3/uL — ABNORMAL LOW (ref 150–400)
RBC: 5.09 MIL/uL (ref 4.22–5.81)
RDW: 12.2 % (ref 11.5–15.5)
WBC: 6.1 10*3/uL (ref 4.0–10.5)

## 2018-05-16 LAB — COMPREHENSIVE METABOLIC PANEL
ALK PHOS: 119 U/L (ref 38–126)
ALT: 42 U/L (ref 0–44)
ANION GAP: 14 (ref 5–15)
AST: 258 U/L — ABNORMAL HIGH (ref 15–41)
Albumin: 4 g/dL (ref 3.5–5.0)
BUN: 13 mg/dL (ref 6–20)
CALCIUM: 9.2 mg/dL (ref 8.9–10.3)
CO2: 27 mmol/L (ref 22–32)
Chloride: 89 mmol/L — ABNORMAL LOW (ref 98–111)
Creatinine, Ser: 0.78 mg/dL (ref 0.61–1.24)
GFR calc Af Amer: >60 mL/min (ref >60)
GFR calc non Af Amer: >60 mL/min (ref >60)
Glucose, Bld: 111 mg/dL — ABNORMAL HIGH (ref 70–99)
Potassium: 4.1 mmol/L (ref 3.5–5.1)
SODIUM: 130 mmol/L — AB (ref 135–145)
Total Bilirubin: 3.3 mg/dL — ABNORMAL HIGH (ref 0.3–1.2)
Total Protein: 8.3 g/dL — ABNORMAL HIGH (ref 6.5–8.1)

## 2018-05-16 LAB — ETHANOL

## 2018-05-16 LAB — TROPONIN I

## 2018-05-16 LAB — LIPASE, BLOOD: Lipase: 1552 U/L — ABNORMAL HIGH (ref 11–51)

## 2018-05-16 LAB — HEPATIC FUNCTION PANEL
ALK PHOS: 105 U/L (ref 38–126)
ALT: 32 U/L (ref 0–44)
AST: 179 U/L — ABNORMAL HIGH (ref 15–41)
Albumin: 3.3 g/dL — ABNORMAL LOW (ref 3.5–5.0)
Bilirubin, Direct: 0.8 mg/dL — ABNORMAL HIGH (ref 0.0–0.2)
Indirect Bilirubin: 2.1 mg/dL — ABNORMAL HIGH (ref 0.3–0.9)
Total Bilirubin: 2.9 mg/dL — ABNORMAL HIGH (ref 0.3–1.2)
Total Protein: 7 g/dL (ref 6.5–8.1)

## 2018-05-16 LAB — CBC
HCT: 41.9 % (ref 39.0–52.0)
Hemoglobin: 13.9 g/dL (ref 13.0–17.0)
MCH: 30.2 pg (ref 26.0–34.0)
MCHC: 33.2 g/dL (ref 30.0–36.0)
MCV: 91.1 fL (ref 80.0–100.0)
Platelets: 30 10*3/uL — ABNORMAL LOW (ref 150–400)
RBC: 4.6 MIL/uL (ref 4.22–5.81)
RDW: 12 % (ref 11.5–15.5)
WBC: 5 10*3/uL (ref 4.0–10.5)
nRBC: 0 % (ref 0.0–0.2)

## 2018-05-16 LAB — BASIC METABOLIC PANEL
Anion gap: 13 (ref 5–15)
BUN: 12 mg/dL (ref 6–20)
CO2: 26 mmol/L (ref 22–32)
CREATININE: 0.71 mg/dL (ref 0.61–1.24)
Calcium: 8.3 mg/dL — ABNORMAL LOW (ref 8.9–10.3)
Chloride: 92 mmol/L — ABNORMAL LOW (ref 98–111)
GFR calc non Af Amer: >60 mL/min (ref >60)
Glucose, Bld: 99 mg/dL (ref 70–99)
Potassium: 3.6 mmol/L (ref 3.5–5.1)
SODIUM: 131 mmol/L — AB (ref 135–145)

## 2018-05-16 LAB — LACTIC ACID, PLASMA: Lactic Acid, Venous: 0.9 mmol/L (ref 0.5–1.9)

## 2018-05-16 MED ORDER — FOLIC ACID 1 MG PO TABS
1.0000 mg | ORAL_TABLET | Freq: Every day | ORAL | Status: DC
  Administered 2018-05-16 – 2018-05-17 (×2): 1 mg via ORAL
  Filled 2018-05-16 (×2): qty 1

## 2018-05-16 MED ORDER — ACETAMINOPHEN 650 MG RE SUPP: 650.0000 mg | Freq: Four times a day (QID) | RECTAL | Status: DC | PRN

## 2018-05-16 MED ORDER — VITAMIN B-1 100 MG PO TABS
100.0000 mg | ORAL_TABLET | Freq: Every day | ORAL | Status: DC
  Administered 2018-05-16 – 2018-05-17 (×2): 100 mg via ORAL
  Filled 2018-05-16 (×2): qty 1

## 2018-05-16 MED ORDER — PIPERACILLIN-TAZOBACTAM 3.375 G IVPB
3.3750 g | Freq: Three times a day (TID) | INTRAVENOUS | Status: DC
  Administered 2018-05-16 – 2018-05-17 (×4): 3.375 g via INTRAVENOUS
  Filled 2018-05-16 (×5): qty 50

## 2018-05-16 MED ORDER — LORAZEPAM 1 MG PO TABS: 1.0000 mg | ORAL_TABLET | Freq: Four times a day (QID) | ORAL | Status: DC | PRN

## 2018-05-16 MED ORDER — SODIUM CHLORIDE 0.9 % IV SOLN
INTRAVENOUS | Status: DC
  Administered 2018-05-16: 05:00:00 via INTRAVENOUS

## 2018-05-16 MED ORDER — IBUPROFEN 200 MG PO TABS
200.0000 mg | ORAL_TABLET | Freq: Once | ORAL | Status: AC
  Administered 2018-05-16: 200 mg via ORAL
  Filled 2018-05-16: qty 1

## 2018-05-16 MED ORDER — SODIUM CHLORIDE 0.9 % IV BOLUS
1000.0000 mL | Freq: Once | INTRAVENOUS | Status: AC
  Administered 2018-05-16: 1000 mL via INTRAVENOUS

## 2018-05-16 MED ORDER — ONDANSETRON HCL 4 MG/2ML IJ SOLN: 4.0000 mg | Freq: Four times a day (QID) | INTRAMUSCULAR | Status: DC | PRN

## 2018-05-16 MED ORDER — POLYETHYLENE GLYCOL 3350 17 G PO PACK
17.0000 g | PACK | Freq: Two times a day (BID) | ORAL | Status: DC
  Administered 2018-05-16 – 2018-05-17 (×3): 17 g via ORAL
  Filled 2018-05-16 (×3): qty 1

## 2018-05-16 MED ORDER — IOHEXOL 300 MG/ML  SOLN
100.0000 mL | Freq: Once | INTRAMUSCULAR | Status: AC | PRN
  Administered 2018-05-16: 100 mL via INTRAVENOUS

## 2018-05-16 MED ORDER — THIAMINE HCL 100 MG/ML IJ SOLN: 100.0000 mg | Freq: Every day | INTRAMUSCULAR | Status: DC

## 2018-05-16 MED ORDER — MORPHINE SULFATE (PF) 2 MG/ML IV SOLN: 2.0000 mg | INTRAVENOUS | Status: DC | PRN

## 2018-05-16 MED ORDER — MORPHINE SULFATE (PF) 4 MG/ML IV SOLN
4.0000 mg | Freq: Once | INTRAVENOUS | Status: AC
  Administered 2018-05-16: 4 mg via INTRAVENOUS
  Filled 2018-05-16: qty 1

## 2018-05-16 MED ORDER — SODIUM CHLORIDE 0.9 % IV SOLN
INTRAVENOUS | Status: AC
  Administered 2018-05-17: 04:00:00 via INTRAVENOUS

## 2018-05-16 MED ORDER — ADULT MULTIVITAMIN W/MINERALS CH
1.0000 | ORAL_TABLET | Freq: Every day | ORAL | Status: DC
  Administered 2018-05-16 – 2018-05-17 (×2): 1 via ORAL
  Filled 2018-05-16 (×2): qty 1

## 2018-05-16 MED ORDER — ACETAMINOPHEN 325 MG PO TABS
650.0000 mg | ORAL_TABLET | Freq: Four times a day (QID) | ORAL | Status: DC | PRN
  Administered 2018-05-16: 650 mg via ORAL
  Filled 2018-05-16: qty 2

## 2018-05-16 MED ORDER — LORAZEPAM 2 MG/ML IJ SOLN: 1.0000 mg | Freq: Four times a day (QID) | INTRAMUSCULAR | Status: DC | PRN

## 2018-05-16 NOTE — ED Notes (Signed)
ED TO INPATIENT HANDOFF REPORT  ED Nurse Name and Phone #:  Minus Liberty RN [phone]  S Name/Age/Gender Cole Ward 48 y.o. male Room/Bed: 016C/016C  Code Status   Code Status: Prior  Home/SNF/Other Home Patient oriented to: self, place, time and situation Is this baseline? Yes   Triage Complete: Triage complete  Chief Complaint abd pain  Triage Note Pt BIB EMS c/o Lower abdominal pain starting yesterday. Pt. Denies NVD. Pt states he feels constipated. Pt last Bowel movement was 3 days ago.  Pt. Also c/o occipital pain with dizziness.    Allergies No Known Allergies  Level of Care/Admitting Diagnosis ED Disposition    ED Disposition Condition Comment   Admit  Hospital Area: [facility] [[id number]]  Level of Care: Telemetry Medical [104]  I expect the patient will be discharged within 24 hours: No (not a candidate for 5C-Observation unit)  Diagnosis: Acute pancreatitis [577.0.ICD-9-CM]  Admitting Physician: John Giovanni [[id number]]  Attending Physician: John Giovanni [[id number]]  PT Class (Do Not Modify): Observation [104]  PT Acc Code (Do Not Modify): Observation [10022]       B Medical/Surgery History Past Medical History:  Diagnosis Date  . Alcohol-induced pancreatitis   . Tobacco abuse   . Vision decreased    Past Surgical History:  Procedure Laterality Date  . ESOPHAGOGASTRODUODENOSCOPY N/A 07/07/2014   Procedure: ESOPHAGOGASTRODUODENOSCOPY (EGD);  Surgeon: Jeani Hawking, MD;  Location: [facility] ENDOSCOPY;  Service: Endoscopy;  Laterality: N/A;     A IV Location/Drains/Wounds Patient Lines/Drains/Airways Status   Active Line/Drains/Airways    Name:   Placement date:   Placement time:   Site:   Days:   Peripheral IV 05/15/18 Left Antecubital   05/15/18    2315    Antecubital   1          Intake/Output Last 24 hours No intake or output data in the 24 hours ending 05/16/18 0329  Labs/Imaging Results for orders placed or performed  during the hospital encounter of 05/15/18 (from the past 48 hour(s))  Comprehensive metabolic panel     Status: Abnormal   Collection Time: 05/15/18 11:28 PM  Result Value Ref Range   Sodium 130 (L) 135 - 145 mmol/L   Potassium 4.1 3.5 - 5.1 mmol/L   Chloride 89 (L) 98 - 111 mmol/L   CO2 27 22 - 32 mmol/L   Glucose, Bld 111 (H) 70 - 99 mg/dL   BUN 13 6 - 20 mg/dL   Creatinine, Ser 8.20 0.61 - 1.24 mg/dL   Calcium 9.2 8.9 - 81.3 mg/dL   Total Protein 8.3 (H) 6.5 - 8.1 g/dL   Albumin 4.0 3.5 - 5.0 g/dL   AST 887 (H) 15 - 41 U/L   ALT 42 0 - 44 U/L   Alkaline Phosphatase 119 38 - 126 U/L   Total Bilirubin 3.3 (H) 0.3 - 1.2 mg/dL   GFR calc non Af Amer >60 >60 mL/min   GFR calc Af Amer >60 >60 mL/min   Anion gap 14 5 - 15    Comment: Performed at [facility], [address], [place], [place] [address]  Lipase, blood     Status: Abnormal   Collection Time: 05/15/18 11:28 PM  Result Value Ref Range   Lipase 1,552 (H) 11 - 51 U/L    Comment: RESULTS CONFIRMED BY MANUAL DILUTION Performed at [facility], [address] [place], [place] [address]   CBC with Differential     Status:  Abnormal   Collection Time: 05/15/18 11:28 PM  Result Value Ref Range   WBC 6.1 4.0 - 10.5 K/uL   RBC 5.09 4.22 - 5.81 MIL/uL   Hemoglobin 15.9 13.0 - 17.0 g/dL   HCT 40.3 47.4 - 25.9 %   MCV 91.7 80.0 - 100.0 fL   MCH 31.2 26.0 - 34.0 pg   MCHC 34.0 30.0 - 36.0 g/dL   RDW 56.3 87.5 - 64.3 %   Platelets 35 (L) 150 - 400 K/uL    Comment: REPEATED TO VERIFY PLATELET COUNT CONFIRMED BY SMEAR SPECIMEN CHECKED FOR CLOTS Immature Platelet Fraction may be clinically indicated, consider ordering this additional test [id number]    nRBC 0.0 0.0 - 0.2 %   Neutrophils Relative % 71 %   Neutro Abs 4.4 1.7 - 7.7 K/uL   Lymphocytes Relative 15 %   Lymphs Abs 0.9 0.7 - 4.0 K/uL   Monocytes Relative 10 %   Monocytes Absolute 0.6 0.1 - 1.0 K/uL   Eosinophils Relative 3 %   Eosinophils Absolute  0.2 0.0 - 0.5 K/uL   Basophils Relative 0 %   Basophils Absolute 0.0 0.0 - 0.1 K/uL   Immature Granulocytes 1 %   Abs Immature Granulocytes 0.04 0.00 - 0.07 K/uL    Comment: Performed at [facility], [address], [place], [place] [address]  Troponin I - Once     Status: None   Collection Time: 05/15/18 11:44 PM  Result Value Ref Range   Troponin I <0.03 <0.03 ng/mL    Comment: Performed at [facility], [address], [place], [place] [address]  Ethanol     Status: None   Collection Time: 05/15/18 11:51 PM  Result Value Ref Range   Alcohol, Ethyl (B) <10 <10 mg/dL    Comment: (NOTE) Lowest detectable limit for serum alcohol is 10 mg/dL. For medical purposes only. Performed at [facility], [address] [place], [place] [address]    Ct Abdomen Pelvis W Contrast  Result Date: 05/16/2018 CLINICAL DATA:  Left abdominal pain with feelings of constipation. EXAM: CT ABDOMEN AND PELVIS WITH CONTRAST TECHNIQUE: Multidetector CT imaging of the abdomen and pelvis was performed using the standard protocol following bolus administration of intravenous contrast. CONTRAST:  OMNIPAQUE IOHEXOL 300 MG/ML  SOLN COMPARISON:  10/04/2017 FINDINGS: Lower chest: No acute abnormality. Hepatobiliary: There is diffuse hepatic steatosis. No focal liver abnormality. Gallbladder appears distended. No gallbladder wall thickening or pericholecystic fluid. No biliary ductal dilatation. Pancreas: There is diffuse edema. No main duct dilatation. Cystic structure within posterior head of pancreas measures 2.8 cm, image 32/3. Previously 1.7 cm. Within the neck of pancreas there is a 2 cm structure which is slightly hypodense to adjacent pancreas measuring 2 cm, image 29/3. This may represent a complicated pseudo cyst, focal area of necrosis or less favored a small mass. Spleen: Normal in size without focal abnormality. Adrenals/Urinary Tract: Normal adrenal glands. Normal appearance of the  kidneys. The urinary bladder is unremarkable. Stomach/Bowel: Stomach is normal. The small bowel loops are unremarkable. Normal appearance of the pancreas. No abnormal dilatation of the colon. Vascular/Lymphatic: Aortic atherosclerosis. No aneurysm. The portal vein and intrahepatic veins remain patent. Splenic vein is patent. No abdominopelvic adenopathy. Reproductive: Prostate is unremarkable. Other: There is a small volume of ascites within the abdomen and pelvis. No extra pancreatic fluid collections identified. Musculoskeletal: No acute or significant osseous findings. IMPRESSION: 1. Imaging findings compatible with acute pancreatitis. 2. Cystic  structure within posterior head of pancreas measuring 2.8 cm is identified. In the current clinical setting findings likely represent a pseudocyst. 3. Indeterminate structure which is slightly hypodense to adjacent pancreas is noted within neck of pancreas measuring 2 cm. This is indeterminate and is in an area of previous pseudo cyst. This may represent an area of pancreatic necrosis, complex pseudo cyst or, less favored, a small mass. Advise follow-up imaging after resolution of this acute episode to ensure resolution. 4. Small volume of ascites within the abdomen and pelvis. 5. Hepatic steatosis. Electronically Signed   By: Signa Kell M.D.   On: 05/16/2018 02:16   Dg Chest Port 1 View  Result Date: 05/16/2018 CLINICAL DATA:  48 year old male with lower abdominal pain and fever. EXAM: PORTABLE CHEST 1 VIEW COMPARISON:  Chest radiograph dated 03/06/2015 FINDINGS: The heart size and mediastinal contours are within normal limits. Both lungs are clear. The visualized skeletal structures are unremarkable. IMPRESSION: No active disease. Electronically Signed   By: Elgie Collard M.D.   On: 05/16/2018 00:19    Pending Labs Unresulted Labs (From admission, onward)    Start     Ordered   05/15/18 2347  Urinalysis, Routine w reflex microscopic  ONCE - STAT,   STAT      05/15/18 2346          Vitals/Pain Today's Vitals   05/16/18 0245 05/16/18 0259 05/16/18 0300 05/16/18 0315  BP: (!) 128/98  (!) 127/96 (!) 125/91  Pulse: 88  94 91  Resp:    18  Temp:      TempSrc:      SpO2: 99%  98% 98%  PainSc:  3       Isolation Precautions No active isolations  Medications Medications  sodium chloride 0.9 % bolus 500 mL (0 mLs Intravenous Stopped 05/16/18 0033)  ibuprofen (ADVIL,MOTRIN) tablet 400 mg (400 mg Oral Given 05/16/18 0012)  iohexol (OMNIPAQUE) 300 MG/ML solution 100 mL (100 mLs Intravenous Contrast Given 05/16/18 0135)  morphine 4 MG/ML injection 4 mg (4 mg Intravenous Given 05/16/18 0234)  ibuprofen (ADVIL,MOTRIN) tablet 200 mg (200 mg Oral Given 05/16/18 0258)    Mobility walks Low fall risk   Focused Assessments GI   R Recommendations: See Admitting Provider Note  Report given to:   Additional Notes:  Pt. Speaks Bermese. Interpreter needed. Pt. Understands minimal english.   GI Assessment  Audible Bowel sounds all 4 quadrants. Pt abdomen soft with some distension.  Pt. Reports pain current 3 on 0-10 scale of RLQ and LLQ. Pt. Tender LLQ. Pt. C/o feeling constipated.  Last BM 3/27. This is not normal for pt. Pt. Pervious daily bowel movements. Pt. Has not had BM yet.  Pt. Last rectal temp 100.4.   Pt. Was also admitted c/o headache. At this time pt. Denies headache.

## 2018-05-16 NOTE — Consult Note (Signed)
Referring Provider:  Dr. Hanley Ben  Primary Care Physician:  Julieanne Manson, MD Primary Gastroenterologist: Gentry Fitz  Reason for Consultation: Pancreatitis  HPI: Cole Ward is a 48 y.o. male with past medical history of alcohol induced pancreatitis, was admitted to the hospital in August 2019 with alcoholic pancreatitis, history of thrombocytopenia , history of GI bleed with negative EGD in May 2016 by Dr. Elnoria Howard presented to the hospital with abdominal pain.  Patient speaks Burmese.  Upon initial evaluation, BC showed platelet count of 35 otherwise normal.  Elevated lipase at 1500.  P showed chronically elevated AST as well as chronically elevated total bilirubin at 2.9.  Abdomen pelvis with contrast today showed acute pancreatitis with pancreatic pseudocyst.  Concerning area in the neck of the pancreas which may represent pancreatic necrosis, complex pseudocyst or small mass.  GI is consulted for further evaluation.  Patient seen and examined at bedside.  History obtained with interpreter software.  He was doing fine since discharge until yesterday night when he started having generalized abdominal discomfort.  Not able to describe his pain in detail.  Denied any radiation of pain to the back.  Complaining of constipation with no bowel movement in last 2 to 3 days.  Complaining of nausea but denied any vomiting.  Denies any blood in the stool or black stool.  Complaining of 9 pound weight loss last 6 months.  Last alcohol use 1 month ago.  No previous  colonoscopy.  Past Medical History:  Diagnosis Date  . Alcohol-induced pancreatitis   . Tobacco abuse   . Vision decreased     Past Surgical History:  Procedure Laterality Date  . ESOPHAGOGASTRODUODENOSCOPY N/A 07/07/2014   Procedure: ESOPHAGOGASTRODUODENOSCOPY (EGD);  Surgeon: Jeani Hawking, MD;  Location: [facility] ENDOSCOPY;  Service: Endoscopy;  Laterality: N/A;    Prior to Admission medications   Medication Sig Start Date End Date Taking?  Authorizing Provider  chlordiazePOXIDE (LIBRIUM) 25 MG capsule 50mg  PO TID x 1D, then 25-50mg  PO BID X 1D, then 25-50mg  PO QD X 1D Patient not taking: Reported on 10/04/2017 05/28/17   Elson Areas, PA-C  Multiple Vitamin (MULTIVITAMIN WITH MINERALS) TABS tablet Take 1 tablet by mouth daily.    [provider]  triamcinolone cream (KENALOG) 0.1 % Apply to affected area twice daily as needed Patient not taking: Reported on 10/04/2017 06/10/16   Julieanne Manson, MD    Scheduled Meds: . folic acid  1 mg Oral Daily  . multivitamin with minerals  1 tablet Oral Daily  . thiamine  100 mg Oral Daily   Or  . thiamine  100 mg Intravenous Daily   Continuous Infusions: . sodium chloride Stopped (05/16/18 0504)  . piperacillin-tazobactam (ZOSYN)  IV 12.5 mL/hr at 05/16/18 0544   PRN Meds:.acetaminophen **OR** acetaminophen, LORazepam **OR** LORazepam, morphine injection, ondansetron (ZOFRAN) IV  Allergies as of 05/15/2018  . (No Known Allergies)    No family history on file.  Social History   Socioeconomic History  . Marital status: Married    Spouse name: Baw Meh  . Number of children: Not on file  . Years of education: Not on file  . Highest education level: Not on file  Occupational History  . Occupation: unemployed  Social Needs  . Financial resource strain: Not on file  . Food insecurity:    Worry: Not on file    Inability: Not on file  . Transportation needs:    Medical: Not on file    Non-medical: Not on file  Tobacco Use  . Smoking status: Current Every Day Smoker    Packs/day: 0.25    Years: 35.00    Pack years: 8.75    Types: Cigarettes  . Smokeless tobacco: Never Used  Substance and Sexual Activity  . Alcohol use: Yes    Alcohol/week: 3.0 standard drinks    Types: 3 Shots of liquor per week  . Drug use: No  . Sexual activity: Not on file  Lifestyle  . Physical activity:    Days per week: Not on file    Minutes per session: Not on file  . Stress:  Not on file  Relationships  . Social connections:    Talks on phone: Not on file    Gets together: Not on file    Attends religious service: Not on file    Active member of club or organization: Not on file    Attends meetings of clubs or organizations: Not on file    Relationship status: Not on file  . Intimate partner violence:    Fear of current or ex partner: Not on file    Emotionally abused: Not on file    Physically abused: Not on file    Forced sexual activity: Not on file  Other Topics Concern  . Not on file  Social History Narrative   Originally from [place]   [place].   Came [place] In [place] Jan[place]   Lives with wife Baw Meh, and her 3 children from her previous marriage.    Review of Systems: All negative except as stated above in HPI.  Physical Exam: Vital signs: Vitals:   05/16/18 0330 05/16/18 0421  BP: (!) 137/98 (!) 123/94  Pulse: 82 74  Resp: 20 18  Temp:  98.6 F (37 C)  SpO2: 99% 99%   Last BM Date: 05/13/18 Physical Exam  Constitutional: He is oriented to person, place, and time. He appears well-developed and well-nourished. No distress.  HENT:  Head: Normocephalic and atraumatic.  Mouth/Throat: Oropharynx is clear and moist. No oropharyngeal exudate.  Eyes: EOM are normal. No scleral icterus.  Neck: Normal range of motion. Neck supple.  Cardiovascular: Normal rate, regular rhythm and normal heart sounds.  Pulmonary/Chest: Effort normal and breath sounds normal. No respiratory distress.  Abdominal: Bowel sounds are normal. He exhibits distension. There is abdominal tenderness. There is no rebound and no guarding.  Generalized tenderness to palpation.  No rebound or guarding.  No peritoneal signs.  Abdomen is mildly distended  Musculoskeletal: Normal range of motion.        General: No edema.  Neurological: He is alert and oriented to person, place, and time.  Skin: Skin is warm. No erythema.  Psychiatric:  He has a normal mood and affect. Judgment and thought content normal.  Vitals reviewed.   GI:  Lab Results: Recent Labs    05/15/18 2328 05/16/18 0526  WBC 6.1 5.0  HGB 15.9 13.9  HCT 46.7 41.9  PLT 35* 30*   BMET Recent Labs    05/15/18 2328 05/16/18 0526  NA 130* 131*  K 4.1 3.6  CL 89* 92*  CO2 27 26  GLUCOSE 111* 99  BUN 13 12  CREATININE 0.78 0.71  CALCIUM 9.2 8.3*   LFT Recent Labs    05/16/18 0526  PROT 7.0  ALBUMIN 3.3*  AST 179*  ALT 32  ALKPHOS 105  BILITOT 2.9*  BILIDIR 0.8*  IBILI 2.1*   PT/INR  No results for input(s): LABPROT, INR in the last 72 hours.   Studies/Results: Ct Abdomen Pelvis W Contrast  Result Date: 05/16/2018 CLINICAL DATA:  Left abdominal pain with feelings of constipation. EXAM: CT ABDOMEN AND PELVIS WITH CONTRAST TECHNIQUE: Multidetector CT imaging of the abdomen and pelvis was performed using the standard protocol following bolus administration of intravenous contrast. CONTRAST:  OMNIPAQUE IOHEXOL 300 MG/ML  SOLN COMPARISON:  10/04/2017 FINDINGS: Lower chest: No acute abnormality. Hepatobiliary: There is diffuse hepatic steatosis. No focal liver abnormality. Gallbladder appears distended. No gallbladder wall thickening or pericholecystic fluid. No biliary ductal dilatation. Pancreas: There is diffuse edema. No main duct dilatation. Cystic structure within posterior head of pancreas measures 2.8 cm, image 32/3. Previously 1.7 cm. Within the neck of pancreas there is a 2 cm structure which is slightly hypodense to adjacent pancreas measuring 2 cm, image 29/3. This may represent a complicated pseudo cyst, focal area of necrosis or less favored a small mass. Spleen: Normal in size without focal abnormality. Adrenals/Urinary Tract: Normal adrenal glands. Normal appearance of the kidneys. The urinary bladder is unremarkable. Stomach/Bowel: Stomach is normal. The small bowel loops are unremarkable. Normal appearance of the pancreas. No  abnormal dilatation of the colon. Vascular/Lymphatic: Aortic atherosclerosis. No aneurysm. The portal vein and intrahepatic veins remain patent. Splenic vein is patent. No abdominopelvic adenopathy. Reproductive: Prostate is unremarkable. Other: There is a small volume of ascites within the abdomen and pelvis. No extra pancreatic fluid collections identified. Musculoskeletal: No acute or significant osseous findings. IMPRESSION: 1. Imaging findings compatible with acute pancreatitis. 2. Cystic structure within posterior head of pancreas measuring 2.8 cm is identified. In the current clinical setting findings likely represent a pseudocyst. 3. Indeterminate structure which is slightly hypodense to adjacent pancreas is noted within neck of pancreas measuring 2 cm. This is indeterminate and is in an area of previous pseudo cyst. This may represent an area of pancreatic necrosis, complex pseudo cyst or, less favored, a small mass. Advise follow-up imaging after resolution of this acute episode to ensure resolution. 4. Small volume of ascites within the abdomen and pelvis. 5. Hepatic steatosis. Electronically Signed   By: Signa Kell M.D.   On: 05/16/2018 02:16   Dg Chest Port 1 View  Result Date: 05/16/2018 CLINICAL DATA:  48 year old male with lower abdominal pain and fever. EXAM: PORTABLE CHEST 1 VIEW COMPARISON:  Chest radiograph dated 03/06/2015 FINDINGS: The heart size and mediastinal contours are within normal limits. Both lungs are clear. The visualized skeletal structures are unremarkable. IMPRESSION: No active disease. Electronically Signed   By: Elgie Collard M.D.   On: 05/16/2018 00:19    Impression/Plan: -Acute pancreatitis.  Prior alcohol use.  Although patient is denying alcohol use for last 1 month, his AST is elevated concerning for ongoing alcohol use.  Normal triglycerides in August 2019.  CT scan recently and ultrasound previously negative for gallstones. -Possible pancreatic necrosis or  complex pseudocyst in the neck of the pancreas. -Abnormal LFTs.  Chronic -Thrombocytopenia.  Chronic  -Constipation.  Recommendations ------------------------ -Restart IV hydration at 200 cc/h. -Add MiraLAX twice a day for constipation. -He has been started on antibiotics for fever, may be consider discontinuing in the next 24 to 48 hours if he remains afebrile. -Recommend repeat CT scan or MRI in next 2 to 3 days. -Okay to have clear liquid diet from GI standpoint. -GI will follow.    LOS: 0 days   Kathi Der  MD, FACP 05/16/2018, 10:56 AM  Contact #  [phone] 

## 2018-05-16 NOTE — H&P (Signed)
History and Physical    Cole Ward ZJI:[id number] DOB: 1970-12-06 DOA: 05/15/2018  PCP: Cole Hook, MD Patient coming from: Home  Chief Complaint: Abdominal pain  HPI: Cole Ward is a 48 y.o. male with medical history significant of alcoholic pancreatitis, transaminitis, previous GI bleed, thrombocytopenia presenting to the hospital for evaluation of abdominal pain.  Patient speaks Burmese.  Interpreter services used.  History provided by patient very limited.  Reports having pain in his right lower quadrant which started last night.  He is not able to describe the severity or character of the pain.  States he has not been able to drink water because of the pain.  It is not clear whether the patient has been vomiting as interpreter informed me that the patient kept on repeating he has pain in his abdomen.  Patient denies any fevers, cough, shortness of breath, sick contacts, or exposure to any individual with coronavirus infection.  Denies having any headaches.  Reports feeling dizzy when going from supine to sitting position.  No additional history could be obtained at this time.  Review of Systems: As per HPI otherwise 10 point review of systems negative.  Past Medical History:  Diagnosis Date  . Alcohol-induced pancreatitis   . Tobacco abuse   . Vision decreased     Past Surgical History:  Procedure Laterality Date  . ESOPHAGOGASTRODUODENOSCOPY N/A 07/07/2014   Procedure: ESOPHAGOGASTRODUODENOSCOPY (EGD);  Surgeon: Carol Ada, MD;  Location: [facility] ENDOSCOPY;  Service: Endoscopy;  Laterality: N/A;     reports that he has been smoking cigarettes. He has a 8.75 pack-year smoking history. He has never used smokeless tobacco. He reports current alcohol use of about 3.0 standard drinks of alcohol per week. He reports that he does not use drugs.  No Known Allergies  No family history on file.  Prior to Admission medications   Medication Sig Start Date End Date Taking? Authorizing  Provider  chlordiazePOXIDE (LIBRIUM) 25 MG capsule 72m PO TID x 1D, then 25-530mPO BID X 1D, then 25-5047mO QD X 1D Patient not taking: Reported on 10/04/2017 05/28/17   SofFransico MeadowA-C  Multiple Vitamin (MULTIVITAMIN WITH MINERALS) TABS tablet Take 1 tablet by mouth daily.    [provider]  triamcinolone cream (KENALOG) 0.1 % Apply to affected area twice daily as needed Patient not taking: Reported on 10/04/2017 06/10/16   MulMack HookD    Physical Exam: Vitals:   05/16/18 0315 05/16/18 0330 05/16/18 0421 05/16/18 0519  BP: (!) 125/91 (!) 137/98 (!) 123/94   Pulse: 91 82 74   Resp: '18 20 18   ' Temp:   98.6 F (37 C)   TempSrc:   Oral   SpO2: 98% 99% 99%   Weight:    53.8 kg  Height:    '5\' 3"'  (1.6 m)    Physical Exam  Constitutional: He is oriented to person, place, and time.  Ill-appearing  HENT:  Head: Normocephalic.  Dry mucous membranes  Eyes: Right eye exhibits no discharge. Left eye exhibits no discharge.  Neck: Neck supple.  No nuchal rigidity  Cardiovascular: Normal rate, regular rhythm and intact distal pulses.  Pulmonary/Chest: Effort normal and breath sounds normal. No respiratory distress. He has no wheezes. He has no rales.  Abdominal: Bowel sounds are normal. He exhibits no distension. There is abdominal tenderness. There is guarding. There is no rebound.  Epigastrium and right lower quadrant tender to palpation  Musculoskeletal:        General:  No edema.  Neurological: He is alert and oriented to person, place, and time.  Tongue midline, no facial droop. Strength 5 out of 5 in bilateral upper and lower extremities. Sensation to light touch intact throughout.  Skin: Skin is warm. He is diaphoretic.     Labs on Admission: I have personally reviewed following labs and imaging studies  CBC: Recent Labs  Lab 05/15/18 2328  WBC 6.1  NEUTROABS 4.4  HGB 15.9  HCT 46.7  MCV 91.7  PLT 35*   Basic Metabolic Panel: Recent Labs   Lab 05/15/18 2328  NA 130*  K 4.1  CL 89*  CO2 27  GLUCOSE 111*  BUN 13  CREATININE 0.78  CALCIUM 9.2   GFR: Estimated Creatinine Clearance: 84.1 mL/min (by C-G formula based on SCr of 0.78 mg/dL). Liver Function Tests: Recent Labs  Lab 05/15/18 2328  AST 258*  ALT 42  ALKPHOS 119  BILITOT 3.3*  PROT 8.3*  ALBUMIN 4.0   Recent Labs  Lab 05/15/18 2328  LIPASE 1,552*   No results for input(s): AMMONIA in the last 168 hours. Coagulation Profile: No results for input(s): INR, PROTIME in the last 168 hours. Cardiac Enzymes: Recent Labs  Lab 05/15/18 2344  TROPONINI <0.03   BNP (last 3 results) No results for input(s): PROBNP in the last 8760 hours. HbA1C: No results for input(s): HGBA1C in the last 72 hours. CBG: No results for input(s): GLUCAP in the last 168 hours. Lipid Profile: No results for input(s): CHOL, HDL, LDLCALC, TRIG, CHOLHDL, LDLDIRECT in the last 72 hours. Thyroid Function Tests: No results for input(s): TSH, T4TOTAL, FREET4, T3FREE, THYROIDAB in the last 72 hours. Anemia Panel: No results for input(s): VITAMINB12, FOLATE, FERRITIN, TIBC, IRON, RETICCTPCT in the last 72 hours. Urine analysis:    Component Value Date/Time   COLORURINE AMBER (A) 07/05/2014 2059   APPEARANCEUR CLEAR 07/05/2014 2059   LABSPEC 1.007 07/05/2014 2059   PHURINE 6.0 07/05/2014 2059   GLUCOSEU NEGATIVE 07/05/2014 2059   HGBUR NEGATIVE 07/05/2014 2059   BILIRUBINUR NEGATIVE 07/05/2014 2059   KETONESUR NEGATIVE 07/05/2014 2059   PROTEINUR NEGATIVE 07/05/2014 2059   UROBILINOGEN 1.0 07/05/2014 2059   NITRITE NEGATIVE 07/05/2014 2059   LEUKOCYTESUR NEGATIVE 07/05/2014 2059    Radiological Exams on Admission: Ct Abdomen Pelvis W Contrast  Result Date: 05/16/2018 CLINICAL DATA:  Left abdominal pain with feelings of constipation. EXAM: CT ABDOMEN AND PELVIS WITH CONTRAST TECHNIQUE: Multidetector CT imaging of the abdomen and pelvis was performed using the standard  protocol following bolus administration of intravenous contrast. CONTRAST:  11m OMNIPAQUE IOHEXOL 300 MG/ML  SOLN COMPARISON:  10/04/2017 FINDINGS: Lower chest: No acute abnormality. Hepatobiliary: There is diffuse hepatic steatosis. No focal liver abnormality. Gallbladder appears distended. No gallbladder wall thickening or pericholecystic fluid. No biliary ductal dilatation. Pancreas: There is diffuse edema. No main duct dilatation. Cystic structure within posterior head of pancreas measures 2.8 cm, image 32/3. Previously 1.7 cm. Within the neck of pancreas there is a 2 cm structure which is slightly hypodense to adjacent pancreas measuring 2 cm, image 29/3. This may represent a complicated pseudo cyst, focal area of necrosis or less favored a small mass. Spleen: Normal in size without focal abnormality. Adrenals/Urinary Tract: Normal adrenal glands. Normal appearance of the kidneys. The urinary bladder is unremarkable. Stomach/Bowel: Stomach is normal. The small bowel loops are unremarkable. Normal appearance of the pancreas. No abnormal dilatation of the colon. Vascular/Lymphatic: Aortic atherosclerosis. No aneurysm. The portal vein and intrahepatic veins remain  patent. Splenic vein is patent. No abdominopelvic adenopathy. Reproductive: Prostate is unremarkable. Other: There is a small volume of ascites within the abdomen and pelvis. No extra pancreatic fluid collections identified. Musculoskeletal: No acute or significant osseous findings. IMPRESSION: 1. Imaging findings compatible with acute pancreatitis. 2. Cystic structure within posterior head of pancreas measuring 2.8 cm is identified. In the current clinical setting findings likely represent a pseudocyst. 3. Indeterminate structure which is slightly hypodense to adjacent pancreas is noted within neck of pancreas measuring 2 cm. This is indeterminate and is in an area of previous pseudo cyst. This may represent an area of pancreatic necrosis, complex  pseudo cyst or, less favored, a small mass. Advise follow-up imaging after resolution of this acute episode to ensure resolution. 4. Small volume of ascites within the abdomen and pelvis. 5. Hepatic steatosis. Electronically Signed   By: Kerby Moors M.D.   On: 05/16/2018 02:16   Dg Chest Port 1 View  Result Date: 05/16/2018 CLINICAL DATA:  48 year old male with lower abdominal pain and fever. EXAM: PORTABLE CHEST 1 VIEW COMPARISON:  Chest radiograph dated 03/06/2015 FINDINGS: The heart size and mediastinal contours are within normal limits. Both lungs are clear. The visualized skeletal structures are unremarkable. IMPRESSION: No active disease. Electronically Signed   By: Anner Crete M.D.   On: 05/16/2018 00:19    EKG: Independently reviewed.  Sinus rhythm, early repolarization abnormality similar to prior tracing from May 2016.  Assessment/Plan Principal Problem:   Acute pancreatitis Active Problems:   Alcohol abuse   Pancreatic pseudocyst   Headache   Hyponatremia   Acute pancreatitis complicated by pseudocyst, possible area of necrosis -Rectal temperature 100.7.  Not tachycardic.  Not hypotensive.  No sepsis physiology at this time. -Lipase 1552.  AST 258, chronically elevated.  T bili 3.3, chronically elevated and improved since prior labs.  ALT and alk phos normal. -CT showing evidence of acute pancreatitis with 2.8 cm cystic structure within the posterior head of the pancreas thought to likely represent a pseudocyst.  There is also an indeterminate slightly hypodense structure adjacent to the pancreas within the neck of the pancreas measuring 2 cm which is thought to represent pancreatic necrosis, complex pseudocyst, or less favored, a small mass. -Zosyn -IV morphine PRN pain -IV fluid resuscitation -Blood culture x2 -Tylenol PRN  -Continue to monitor hepatic function panel -Check lactic acid level -Discussed case and CT findings with GI.  Recommendation is to repeat  imaging in 6 to 8 weeks.  Fever Likely related to acute complicated pancreatitis. Low suspicion for SARS-CoV-2 given no infiltrates on chest x-ray, hypoxia, lymphopenia, cough, or shortness of breath.  Headache Patient reported having a headache upon arrival to the ED.  No focal neuro deficit.  No nuchal rigidity.  Denied having a headache when I asked but had already received ibuprofen and morphine previously. -Tylenol PRN -Continue to monitor  Hyponatremia Sodium 130.  Likely related to decreased p.o. intake. -IV fluid -Continue to monitor BMP  Chronic thrombocytopenia Platelet count 35,000, chronically low.  No signs of active bleeding. -Continue to monitor CBC -No anticoagulation for DVT prophylaxis  History of alcohol abuse -CIWA protocol; Ativan PRN -Thiamine, folate, multivitamin  DVT prophylaxis: SCDs Code Status: Full code Family Communication: No family available. Disposition Plan: Anticipate discharge after clinical improvement. Consults called:  GI (Dr. Silverio Decamp) Admission status: It is my clinical opinion that admission to INPATIENT is reasonable and necessary in this 48 y.o. male . presenting with symptoms of abdominal pain concerning  for acute complicated pancreatitis . in the context of PMH including: Alcoholic pancreatitis . with pertinent positives on physical exam including: Abdominal pain . and pertinent positives on radiographic and laboratory data including: CT with evidence of pancreatitis complicated by pseudocyst, possible area of necrosis . Workup and treatment include IV fluid resuscitation, IV pain medication, and IV antibiotic.  Given the aforementioned, the predictability of an adverse outcome is felt to be significant. I expect that the patient will require at least 2 midnights in the hospital to treat this condition.   This chart was dictated using voice recognition software.  Despite best efforts to proofread, errors can occur which can  change the documentation meaning.  Shela Leff MD [facility] Pager [phone]  If 7PM-7AM, please contact night-coverage [email or web address] Password [email or web address]  05/16/2018, 6:30 AM

## 2018-05-16 NOTE — Progress Notes (Signed)
Patient ID: Cole Ward, male   DOB: 1970/05/11, 48 y.o.   MRN: [id number] Patient was admitted early this morning for acute pancreatitis complicated by pseudocyst possibility of necrosis.  I have reviewed patient's medical records including this morning's H&P, vitals, lab work myself.  Continue IV fluids at 200 cc an hour.  Continue n.p.o. for now.  Repeat a.m. labs.  Case was discussed by the admitting provider with GI on-call who recommended to repeat imaging in 6 to 8 weeks.

## 2018-05-16 NOTE — ED Notes (Signed)
Patient transported to CT 

## 2018-05-17 DIAGNOSIS — K863 Pseudocyst of pancreas: Secondary | ICD-10-CM

## 2018-05-17 DIAGNOSIS — E871 Hypo-osmolality and hyponatremia: Secondary | ICD-10-CM

## 2018-05-17 LAB — COMPREHENSIVE METABOLIC PANEL
ALK PHOS: 95 U/L (ref 38–126)
ALT: 24 U/L (ref 0–44)
AST: 127 U/L — ABNORMAL HIGH (ref 15–41)
Albumin: 2.7 g/dL — ABNORMAL LOW (ref 3.5–5.0)
Anion gap: 10 (ref 5–15)
BUN: 8 mg/dL (ref 6–20)
CALCIUM: 7.9 mg/dL — AB (ref 8.9–10.3)
CO2: 22 mmol/L (ref 22–32)
Chloride: 100 mmol/L (ref 98–111)
Creatinine, Ser: 0.63 mg/dL (ref 0.61–1.24)
GFR calc Af Amer: >60 mL/min (ref >60)
GFR calc non Af Amer: >60 mL/min (ref >60)
Glucose, Bld: 81 mg/dL (ref 70–99)
Potassium: 3.1 mmol/L — ABNORMAL LOW (ref 3.5–5.1)
Sodium: 132 mmol/L — ABNORMAL LOW (ref 135–145)
Total Bilirubin: 2.3 mg/dL — ABNORMAL HIGH (ref 0.3–1.2)
Total Protein: 5.9 g/dL — ABNORMAL LOW (ref 6.5–8.1)

## 2018-05-17 LAB — CBC WITH DIFFERENTIAL/PLATELET
Abs Immature Granulocytes: 0.02 10*3/uL (ref 0.00–0.07)
Basophils Absolute: 0 10*3/uL (ref 0.0–0.1)
Basophils Relative: 0 %
Eosinophils Absolute: 0.3 10*3/uL (ref 0.0–0.5)
Eosinophils Relative: 6 %
HCT: 37.2 % — ABNORMAL LOW (ref 39.0–52.0)
Hemoglobin: 12.2 g/dL — ABNORMAL LOW (ref 13.0–17.0)
Immature Granulocytes: 0 %
Lymphocytes Relative: 22 %
Lymphs Abs: 1 10*3/uL (ref 0.7–4.0)
MCH: 30.1 pg (ref 26.0–34.0)
MCHC: 32.8 g/dL (ref 30.0–36.0)
MCV: 91.9 fL (ref 80.0–100.0)
Monocytes Absolute: 0.7 10*3/uL (ref 0.1–1.0)
Monocytes Relative: 15 %
Neutro Abs: 2.7 10*3/uL (ref 1.7–7.7)
Neutrophils Relative %: 57 %
Platelets: 31 10*3/uL — ABNORMAL LOW (ref 150–400)
RBC: 4.05 MIL/uL — ABNORMAL LOW (ref 4.22–5.81)
RDW: 12.2 % (ref 11.5–15.5)
WBC: 4.7 10*3/uL (ref 4.0–10.5)
nRBC: 0 % (ref 0.0–0.2)

## 2018-05-17 LAB — MAGNESIUM: Magnesium: 1.3 mg/dL — ABNORMAL LOW (ref 1.7–2.4)

## 2018-05-17 MED ORDER — POTASSIUM CHLORIDE CRYS ER 20 MEQ PO TBCR
40.0000 meq | EXTENDED_RELEASE_TABLET | ORAL | Status: AC
  Administered 2018-05-17 (×2): 40 meq via ORAL
  Filled 2018-05-17 (×2): qty 2

## 2018-05-17 MED ORDER — MAGNESIUM SULFATE 2 GM/50ML IV SOLN
2.0000 g | Freq: Once | INTRAVENOUS | Status: AC
  Administered 2018-05-17: 2 g via INTRAVENOUS
  Filled 2018-05-17: qty 50

## 2018-05-17 NOTE — Progress Notes (Signed)
Patient adamant about going home.  Called Dr. Hanley Ben and he is not ready to discharge patient.  Signed AMA form. Had interpreter help with communication in Burmese.

## 2018-05-17 NOTE — TOC Progression Note (Signed)
Transition of Care [facility]) - Progression Note    Patient Details  Name: Cole Ward MRN: [id number] Date of Birth: 08-Oct-1970  Transition of Care [facility]) CM/SW Contact  Epifanio Lesches, RN Phone Number: 05/17/2018, 10:45 AM  Clinical Narrative:    Admitted with Acute pancreatitis, hx of alcoholic parotitis, GI bleed, thrombocytopenia. From home with family. Burmese speaking.    NCM following for TOC needs ....  Hospital f/u scheduled per NCM with pt's PCP's office for 07/05/2018 @ 10:30 am and noted on AVS. Pt will probably need Match Letter to assist with medication needs... NCM will follow.  Expected Discharge Plan and Services                                  Social Determinants of Health (SDOH) Interventions    Readmission Risk Interventions No flowsheet data found.

## 2018-05-17 NOTE — Discharge Summary (Signed)
Physician Discharge Summary  Cole Ward SJG:[id number] DOB: 03-25-1970 DOA: 05/15/2018  PCP: Julieanne Manson, MD  Admit date: 05/15/2018 Discharge date: 05/17/2018  Admitted From: Home Disposition: Signed out AGAINST MEDICAL ADVICE  CODE STATUS: Full   Brief/Interim Summary: 48 year old Burmese speaking male with history of alcoholic parotitis, transaminitis, previous GI bleed, thrombocytopenia presented with abdominal pain.  He was found to have acute parotitis with pseudocyst and possible area of necrosis.  He was empirically started on Zosyn along with IV fluids.  GI was consulted.  His condition was improving and diet was gradually being advanced.  Plan was to do an MRI/MRCP tomorrow as per GI.  He signed out AGAINST MEDICAL ADVICE on 05/17/2018. Discharge Diagnoses:  Principal Problem:   Acute pancreatitis Active Problems:   Alcohol abuse   Pancreatic pseudocyst   Headache   Hyponatremia  Acute pancreatitis with pseudocyst with question of necrosis -Was being treated with IV fluids.  Initially was kept n.p.o. and subsequently diet was started and being advanced. Symptomatically improving.  GI following.  Abdominal exam improving.  Afebrile.  Respiratory status stable.    Initially started on Zosyn which was subsequently discontinued today because there was no evidence of infected pancreatic necrosis.  No temperature spikes since hospitalization  Cultures negative so far.  As per GI, probable plan for MRI of the abdomen tomorrow and if symptomatically improving, probably patient can be discharged tomorrow -He signed out AGAINST MEDICAL ADVICE on 05/17/2018.  He was explained the risks of signing out AGAINST MEDICAL ADVICE including worsening of his general condition and even death.   Hyponatremia -Improving.    IV fluids will be continued.  Hypokalemia -Being replaced  Hypomagnesemia -Being replaced  Chronic thrombocytopenia -Platelet count 31,000 today.    Will need  outpatient monitoring.  No signs of active bleeding.  History of alcohol abuse -Was being treated with CIWA protocol along with as needed Ativan and thiamine, multivitamin and folate.  Patient will need to abstain from alcohol.  Discharge Instructions    Follow-up Information    [facility] Follow up.   Specialty:  Family Medicine Why:  hospital follow appointment scheduled for 07/05/2018 at 10:30 am with Dr. Julieanne Manson Contact information: [address] [place] [address] [phone]         No Known Allergies  Consultations:  GI   Procedures/Studies: Ct Abdomen Pelvis W Contrast  Result Date: 05/16/2018 CLINICAL DATA:  Left abdominal pain with feelings of constipation. EXAM: CT ABDOMEN AND PELVIS WITH CONTRAST TECHNIQUE: Multidetector CT imaging of the abdomen and pelvis was performed using the standard protocol following bolus administration of intravenous contrast. CONTRAST:  OMNIPAQUE IOHEXOL 300 MG/ML  SOLN COMPARISON:  10/04/2017 FINDINGS: Lower chest: No acute abnormality. Hepatobiliary: There is diffuse hepatic steatosis. No focal liver abnormality. Gallbladder appears distended. No gallbladder wall thickening or pericholecystic fluid. No biliary ductal dilatation. Pancreas: There is diffuse edema. No main duct dilatation. Cystic structure within posterior head of pancreas measures 2.8 cm, image 32/3. Previously 1.7 cm. Within the neck of pancreas there is a 2 cm structure which is slightly hypodense to adjacent pancreas measuring 2 cm, image 29/3. This may represent a complicated pseudo cyst, focal area of necrosis or less favored a small mass. Spleen: Normal in size without focal abnormality. Adrenals/Urinary Tract: Normal adrenal glands. Normal appearance of the kidneys. The urinary bladder is unremarkable. Stomach/Bowel: Stomach is normal. The small bowel loops are unremarkable. Normal appearance of the pancreas. No  abnormal dilatation of the colon. Vascular/Lymphatic: Aortic atherosclerosis. No aneurysm. The portal vein and intrahepatic veins remain patent. Splenic vein is patent. No abdominopelvic adenopathy. Reproductive: Prostate is unremarkable. Other: There is a small volume of ascites within the abdomen and pelvis. No extra pancreatic fluid collections identified. Musculoskeletal: No acute or significant osseous findings. IMPRESSION: 1. Imaging findings compatible with acute pancreatitis. 2. Cystic structure within posterior head of pancreas measuring 2.8 cm is identified. In the current clinical setting findings likely represent a pseudocyst. 3. Indeterminate structure which is slightly hypodense to adjacent pancreas is noted within neck of pancreas measuring 2 cm. This is indeterminate and is in an area of previous pseudo cyst. This may represent an area of pancreatic necrosis, complex pseudo cyst or, less favored, a small mass. Advise follow-up imaging after resolution of this acute episode to ensure resolution. 4. Small volume of ascites within the abdomen and pelvis. 5. Hepatic steatosis. Electronically Signed   By: Signa Kell M.D.   On: 05/16/2018 02:16   Dg Chest Port 1 View  Result Date: 05/16/2018 CLINICAL DATA:  48 year old male with lower abdominal pain and fever. EXAM: PORTABLE CHEST 1 VIEW COMPARISON:  Chest radiograph dated 03/06/2015 FINDINGS: The heart size and mediastinal contours are within normal limits. Both lungs are clear. The visualized skeletal structures are unremarkable. IMPRESSION: No active disease. Electronically Signed   By: Elgie Collard M.D.   On: 05/16/2018 00:19        The results of significant diagnostics from this hospitalization (including imaging, microbiology, ancillary and laboratory) are listed below for reference.     Microbiology: Recent Results (from the past 240 hour(s))  Culture, blood (routine x 2)     Status: None (Preliminary result)   Collection  Time: 05/16/18  5:25 AM  Result Value Ref Range Status   Specimen Description BLOOD RIGHT ARM  Final   Special Requests   Final    BOTTLES DRAWN AEROBIC AND ANAEROBIC Blood Culture adequate volume   Culture   Final    NO GROWTH 1 DAY Performed at [facility], [address], [place], [place] [address]    Report Status PENDING  Incomplete  Culture, blood (routine x 2)     Status: None (Preliminary result)   Collection Time: 05/16/18  5:28 AM  Result Value Ref Range Status   Specimen Description BLOOD RIGHT HAND  Final   Special Requests   Final    BOTTLES DRAWN AEROBIC ONLY Blood Culture adequate volume   Culture   Final    NO GROWTH 1 DAY Performed at [facility], [address], [place], [place] [address]    Report Status PENDING  Incomplete     Labs: BNP (last 3 results) No results for input(s): BNP in the last 8760 hours. Basic Metabolic Panel: Recent Labs  Lab 05/15/18 2328 05/16/18 0526 05/17/18 0603  NA 130* 131* 132*  K 4.1 3.6 3.1*  CL 89* 92* 100  CO2 GLUCOSE 111* 99 81  BUN CREATININE 0.78 0.71 0.63  CALCIUM 9.2 8.3* 7.9*  MG  --   --  1.3*   Liver Function Tests: Recent Labs  Lab 05/15/18 2328 05/16/18 0526 05/17/18 0603  AST 258* 179* 127*  ALT 42 32 24  ALKPHOS 119 105 95  BILITOT 3.3* 2.9* 2.3*  PROT 8.3* 7.0 5.9*  ALBUMIN 4.0 3.3* 2.7*   Recent Labs  Lab 05/15/18 2328  LIPASE 1,552*  No results for input(s): AMMONIA in the last 168 hours. CBC: Recent Labs  Lab 05/15/18 2328 05/16/18 0526 05/17/18 0603  WBC 6.1 5.0 4.7  NEUTROABS 4.4  --  2.7  HGB 15.9 13.9 12.2*  HCT 46.7 41.9 37.2*  MCV 91.7 91.1 91.9  PLT 35* 30* 31*   Cardiac Enzymes: Recent Labs  Lab 05/15/18 2344  TROPONINI <0.03   BNP: Invalid input(s): POCBNP CBG: No results for input(s): GLUCAP in the last 168 hours. D-Dimer No results for input(s): DDIMER in the last 72 hours. Hgb A1c No results for input(s): HGBA1C in the  last 72 hours. Lipid Profile No results for input(s): CHOL, HDL, LDLCALC, TRIG, CHOLHDL, LDLDIRECT in the last 72 hours. Thyroid function studies No results for input(s): TSH, T4TOTAL, T3FREE, THYROIDAB in the last 72 hours.  Invalid input(s): FREET3 Anemia work up No results for input(s): VITAMINB12, FOLATE, FERRITIN, TIBC, IRON, RETICCTPCT in the last 72 hours. Urinalysis    Component Value Date/Time   COLORURINE AMBER (A) 05/16/2018 1046   APPEARANCEUR CLEAR 05/16/2018 1046   LABSPEC >1.046 (H) 05/16/2018 1046   PHURINE 7.0 05/16/2018 1046   GLUCOSEU NEGATIVE 05/16/2018 1046   HGBUR LARGE (A) 05/16/2018 1046   BILIRUBINUR NEGATIVE 05/16/2018 1046   KETONESUR 5 (A) 05/16/2018 1046   PROTEINUR 30 (A) 05/16/2018 1046   UROBILINOGEN 1.0 07/05/2014 2059   NITRITE NEGATIVE 05/16/2018 1046   LEUKOCYTESUR NEGATIVE 05/16/2018 1046   Sepsis Labs Invalid input(s): PROCALCITONIN,  WBC,  LACTICIDVEN Microbiology Recent Results (from the past 240 hour(s))  Culture, blood (routine x 2)     Status: None (Preliminary result)   Collection Time: 05/16/18  5:25 AM  Result Value Ref Range Status   Specimen Description BLOOD RIGHT ARM  Final   Special Requests   Final    BOTTLES DRAWN AEROBIC AND ANAEROBIC Blood Culture adequate volume   Culture   Final    NO GROWTH 1 DAY Performed at [facility], [address] [place], [place] [address]    Report Status PENDING  Incomplete  Culture, blood (routine x 2)     Status: None (Preliminary result)   Collection Time: 05/16/18  5:28 AM  Result Value Ref Range Status   Specimen Description BLOOD RIGHT HAND  Final   Special Requests   Final    BOTTLES DRAWN AEROBIC ONLY Blood Culture adequate volume   Culture   Final    NO GROWTH 1 DAY Performed at [facility], [address], [place], [place] [address]    Report Status PENDING  Incomplete     Time coordinating discharge: 35 minutes  SIGNED:   Glade Lloyd, MD  [facility] 05/17/2018, 5:07 PM

## 2018-05-17 NOTE — Progress Notes (Signed)
[facility] Progress Note  Cole Ward 48 y.o. 1970/10/21  CC: Pancreatitis   Subjective: Interpreter service used for history taking.  He is feeling better today.  Had a bowel movement yesterday.  Abdominal pain is improving.  tolerating diet.  Denies nausea vomiting diarrhea  ROS : Chest normal shortness of breath.  Negative for bleeding  Objective: Vital signs in last 24 hours: Vitals:   05/17/18 0106 05/17/18 0625  BP: 114/70 117/82  Pulse: 68 69  Resp: 16 18  Temp: 98.2 F (36.8 C) 98.2 F (36.8 C)  SpO2: 100% 99%    Physical Exam:  General:  Alert, cooperative, no distress, appears stated age  Head:  Normocephalic, without obvious abnormality, atraumatic  Eyes:  , EOM's intact,   Lungs:   Clear to auscultation bilaterally, respirations unlabored  Heart:  Regular rate and rhythm, S1, S2 normal  Abdomen:   Soft, non-tender, bowel sounds active all four quadrants,  no masses,   Extremities: Extremities normal, atraumatic, no  edema  Pulses: 2+ and symmetric    Lab Results: Recent Labs    05/16/18 0526 05/17/18 0603  NA 131* 132*  K 3.6 3.1*  CL 92* 100  CO2 26 22  GLUCOSE 99 81  BUN 12 8  CREATININE 0.71 0.63  CALCIUM 8.3* 7.9*  MG  --  1.3*   Recent Labs    05/16/18 0526 05/17/18 0603  AST 179* 127*  ALT 32 24  ALKPHOS 105 95  BILITOT 2.9* 2.3*  PROT 7.0 5.9*  ALBUMIN 3.3* 2.7*   Recent Labs    05/15/18 2328 05/16/18 0526 05/17/18 0603  WBC 6.1 5.0 4.7  NEUTROABS 4.4  --  2.7  HGB 15.9 13.9 12.2*  HCT 46.7 41.9 37.2*  MCV 91.7 91.1 91.9  PLT 35* 30* 31*   No results for input(s): LABPROT, INR in the last 72 hours.    Assessment/Plan: -Acute pancreatitis.  Prior alcohol use.  Although patient is denying alcohol use for last 1 month, his AST is elevated concerning for ongoing alcohol use.  Normal triglycerides in August 2019.  CT scan recently and ultrasound previously negative for gallstones. -Possible pancreatic necrosis or  complex pseudocyst in the neck of the pancreas. -Abnormal LFTs.  Chronic -Thrombocytopenia.  Chronic  -Constipation.  Resolved.  Recommendations ------------------------ -Patient is feeling better today.  Decrease IV hydration 150 cc/h.  Continue MiraLAX. -Stop antibiotics. -MRI MRCP tomorrow. -Okay to advance diet to full liquid.   - GI will follow.    Kathi Der MD, FACP 05/17/2018, 8:33 AM  Contact #  [phone]

## 2018-05-17 NOTE — Progress Notes (Signed)
Patient ID: Cole Ward, male   DOB: 1970/04/20, 48 y.o.   MRN: [id number]  PROGRESS NOTE    Cole Ward  HAF:[id number] DOB: 1970/09/29 DOA: 05/15/2018 PCP: Julieanne Manson, MD   Brief Narrative:  48 year old Burmese speaking male with history of alcoholic parotitis, transaminitis, previous GI bleed, thrombocytopenia presented with abdominal pain.  He was found to have acute parotitis with pseudocyst and possible area of necrosis.  He was empirically started on antibiotics along with IV fluids.  GI was consulted.  Assessment & Plan:   Principal Problem:   Acute pancreatitis Active Problems:   Alcohol abuse   Pancreatic pseudocyst   Headache   Hyponatremia  Acute pancreatitis with pseudocyst with question of necrosis -Currently on normal saline 200 cc an hour.  Symptomatically improving.  Tolerating clear liquid diet.  GI following.  Abdominal exam improving.  Afebrile.  Respiratory status stable.  Will decrease normal saline to 150 cc an hour.  Advance to full liquid diet.  Discontinue Zosyn, no evidence of infected pancreatic necrosis.  Cultures negative so far.  As per GI, probable plan for MRI of the abdomen tomorrow and if symptomatically improving, probably patient can be discharged tomorrow  Hyponatremia -Improving.  Continue IV fluids as above.  Monitor  Hypokalemia -Replace.  Repeat a.m. labs  Hypomagnesemia -Replace.  Repeat a.m. labs  Chronic thrombocytopenia -Platelet count 31,000 today.  No active signs of bleeding.  Monitor CBC.  History of alcohol abuse -CIWA protocol.  Ativan as needed.  Continue thiamine, folate and multivitamin.  Abstain from alcohol.  No signs of withdrawal at this time.  DVT prophylaxis: SCDs Code Status: Full Family Communication: None at bedside Disposition Plan: Home probably in the next 1 to 2 days if clinically improved.  Consultants: GI  Procedures: None  Antimicrobials: Zosyn from 05/15/2018 onwards   Subjective: Patient seen and  examined at bedside with the help of video interpreter.  Denies worsening abdominal pain.  No overnight fever or vomiting.  Objective: Vitals:   05/16/18 0519 05/16/18 1219 05/17/18 0106 05/17/18 0625  BP:  (!) 127/91 114/70 117/82  Pulse:  70 68 69  Resp:  16 16 18   Temp:  98.8 F (37.1 C) 98.2 F (36.8 C) 98.2 F (36.8 C)  TempSrc:  Oral Oral Oral  SpO2:  100% 100% 99%  Weight: 53.8 kg     Height: 5\' 3"  (1.6 m)       Intake/Output Summary (Last 24 hours) at 05/17/2018 3291 Last data filed at 05/16/2018 1030 Gross per 24 hour  Intake -  Output 200 ml  Net -200 ml   Filed Weights   05/16/18 0519  Weight: 53.8 kg    Examination:  General exam: Appears calm and comfortable  Respiratory system: Bilateral decreased breath sounds at bases Cardiovascular system: S1 & S2 heard, Rate controlled Gastrointestinal system: Abdomen is nondistended, soft and mildly tender in the periumbilical region.  Normal bowel sounds heard. Extremities: No cyanosis, clubbing, edema    Data Reviewed: I have personally reviewed following labs and imaging studies  CBC: Recent Labs  Lab 05/15/18 2328 05/16/18 0526 05/17/18 0603  WBC 6.1 5.0 4.7  NEUTROABS 4.4  --  2.7  HGB 15.9 13.9 12.2*  HCT 46.7 41.9 37.2*  MCV 91.7 91.1 91.9  PLT 35* 30* 31*   Basic Metabolic Panel: Recent Labs  Lab 05/15/18 2328 05/16/18 0526 05/17/18 0603  NA 130* 131* 132*  K 4.1 3.6 3.1*  CL 89* 92* 100  CO2 27  26 22  GLUCOSE 111* 99 81  BUN 13 12 8   CREATININE 0.78 0.71 0.63  CALCIUM 9.2 8.3* 7.9*  MG  --   --  1.3*   GFR: Estimated Creatinine Clearance: 84.1 mL/min (by C-G formula based on SCr of 0.63 mg/dL). Liver Function Tests: Recent Labs  Lab 05/15/18 2328 05/16/18 0526 05/17/18 0603  AST 258* 179* 127*  ALT 42 32 24  ALKPHOS 119 105 95  BILITOT 3.3* 2.9* 2.3*  PROT 8.3* 7.0 5.9*  ALBUMIN 4.0 3.3* 2.7*   Recent Labs  Lab 05/15/18 2328  LIPASE 1,552*   No results for input(s):  AMMONIA in the last 168 hours. Coagulation Profile: No results for input(s): INR, PROTIME in the last 168 hours. Cardiac Enzymes: Recent Labs  Lab 05/15/18 2344  TROPONINI <0.03   BNP (last 3 results) No results for input(s): PROBNP in the last 8760 hours. HbA1C: No results for input(s): HGBA1C in the last 72 hours. CBG: No results for input(s): GLUCAP in the last 168 hours. Lipid Profile: No results for input(s): CHOL, HDL, LDLCALC, TRIG, CHOLHDL, LDLDIRECT in the last 72 hours. Thyroid Function Tests: No results for input(s): TSH, T4TOTAL, FREET4, T3FREE, THYROIDAB in the last 72 hours. Anemia Panel: No results for input(s): VITAMINB12, FOLATE, FERRITIN, TIBC, IRON, RETICCTPCT in the last 72 hours. Sepsis Labs: Recent Labs  Lab 05/16/18 0526  LATICACIDVEN 0.9    Recent Results (from the past 240 hour(s))  Culture, blood (routine x 2)     Status: None (Preliminary result)   Collection Time: 05/16/18  5:25 AM  Result Value Ref Range Status   Specimen Description BLOOD RIGHT ARM  Final   Special Requests   Final    BOTTLES DRAWN AEROBIC AND ANAEROBIC Blood Culture adequate volume   Culture   Final    NO GROWTH 1 DAY Performed at [facility], [address] [place], [place] [address]    Report Status PENDING  Incomplete  Culture, blood (routine x 2)     Status: None (Preliminary result)   Collection Time: 05/16/18  5:28 AM  Result Value Ref Range Status   Specimen Description BLOOD RIGHT HAND  Final   Special Requests   Final    BOTTLES DRAWN AEROBIC ONLY Blood Culture adequate volume   Culture   Final    NO GROWTH 1 DAY Performed at [facility], [address], [place], [place] [address]    Report Status PENDING  Incomplete         Radiology Studies: Ct Abdomen Pelvis W Contrast  Result Date: 05/16/2018 CLINICAL DATA:  Left abdominal pain with feelings of constipation. EXAM: CT ABDOMEN AND PELVIS WITH CONTRAST TECHNIQUE: Multidetector CT  imaging of the abdomen and pelvis was performed using the standard protocol following bolus administration of intravenous contrast. CONTRAST:  OMNIPAQUE IOHEXOL 300 MG/ML  SOLN COMPARISON:  10/04/2017 FINDINGS: Lower chest: No acute abnormality. Hepatobiliary: There is diffuse hepatic steatosis. No focal liver abnormality. Gallbladder appears distended. No gallbladder wall thickening or pericholecystic fluid. No biliary ductal dilatation. Pancreas: There is diffuse edema. No main duct dilatation. Cystic structure within posterior head of pancreas measures 2.8 cm, image 32/3. Previously 1.7 cm. Within the neck of pancreas there is a 2 cm structure which is slightly hypodense to adjacent pancreas measuring 2 cm, image 29/3. This may represent a complicated pseudo cyst, focal area of necrosis or less favored a small mass. Spleen: Normal in size without focal abnormality. Adrenals/Urinary  Tract: Normal adrenal glands. Normal appearance of the kidneys. The urinary bladder is unremarkable. Stomach/Bowel: Stomach is normal. The small bowel loops are unremarkable. Normal appearance of the pancreas. No abnormal dilatation of the colon. Vascular/Lymphatic: Aortic atherosclerosis. No aneurysm. The portal vein and intrahepatic veins remain patent. Splenic vein is patent. No abdominopelvic adenopathy. Reproductive: Prostate is unremarkable. Other: There is a small volume of ascites within the abdomen and pelvis. No extra pancreatic fluid collections identified. Musculoskeletal: No acute or significant osseous findings. IMPRESSION: 1. Imaging findings compatible with acute pancreatitis. 2. Cystic structure within posterior head of pancreas measuring 2.8 cm is identified. In the current clinical setting findings likely represent a pseudocyst. 3. Indeterminate structure which is slightly hypodense to adjacent pancreas is noted within neck of pancreas measuring 2 cm. This is indeterminate and is in an area of previous pseudo  cyst. This may represent an area of pancreatic necrosis, complex pseudo cyst or, less favored, a small mass. Advise follow-up imaging after resolution of this acute episode to ensure resolution. 4. Small volume of ascites within the abdomen and pelvis. 5. Hepatic steatosis. Electronically Signed   By: Signa Kell M.D.   On: 05/16/2018 02:16   Dg Chest Port 1 View  Result Date: 05/16/2018 CLINICAL DATA:  48 year old male with lower abdominal pain and fever. EXAM: PORTABLE CHEST 1 VIEW COMPARISON:  Chest radiograph dated 03/06/2015 FINDINGS: The heart size and mediastinal contours are within normal limits. Both lungs are clear. The visualized skeletal structures are unremarkable. IMPRESSION: No active disease. Electronically Signed   By: Elgie Collard M.D.   On: 05/16/2018 00:19        Scheduled Meds: . folic acid  1 mg Oral Daily  . multivitamin with minerals  1 tablet Oral Daily  . polyethylene glycol  17 g Oral BID  . potassium chloride  40 mEq Oral Q4H  . thiamine  100 mg Oral Daily   Or  . thiamine  100 mg Intravenous Daily   Continuous Infusions: . sodium chloride 200 mL/hr at 05/17/18 0343  . magnesium sulfate 1 - 4 g bolus IVPB       LOS: 1 day        Glade Lloyd, MD [facility] 05/17/2018, 8:24 AM

## 2018-05-21 LAB — CULTURE, BLOOD (ROUTINE X 2)
Culture: NO GROWTH
Culture: NO GROWTH
Special Requests: ADEQUATE
Special Requests: ADEQUATE

## 2018-07-05 ENCOUNTER — Ambulatory Visit: Payer: Self-pay | Admitting: Internal Medicine

## 2018-07-19 ENCOUNTER — Ambulatory Visit: Payer: Self-pay | Admitting: Internal Medicine

## 2018-11-30 ENCOUNTER — Inpatient Hospital Stay (HOSPITAL_COMMUNITY)
Admission: EM | Admit: 2018-11-30 | Discharge: 2018-12-03 | DRG: 439 | Discharge status: Against Medical Advice | Payer: Self-pay | Attending: Internal Medicine | Admitting: Internal Medicine

## 2018-11-30 ENCOUNTER — Other Ambulatory Visit: Payer: Self-pay

## 2018-11-30 ENCOUNTER — Emergency Department (HOSPITAL_COMMUNITY): Payer: Self-pay

## 2018-11-30 ENCOUNTER — Emergency Department (HOSPITAL_COMMUNITY)
Admission: EM | Admit: 2018-11-30 | Discharge: 2018-11-30 | Disposition: A | Discharge status: Home / Self Care | Payer: Self-pay | Attending: Emergency Medicine | Admitting: Emergency Medicine

## 2018-11-30 ENCOUNTER — Encounter (HOSPITAL_COMMUNITY): Payer: Self-pay | Admitting: Obstetrics and Gynecology

## 2018-11-30 DIAGNOSIS — Z5321 Procedure and treatment not carried out due to patient leaving prior to being seen by health care provider: Secondary | ICD-10-CM | POA: Insufficient documentation

## 2018-11-30 DIAGNOSIS — Z20828 Contact with and (suspected) exposure to other viral communicable diseases: Secondary | ICD-10-CM | POA: Diagnosis present

## 2018-11-30 DIAGNOSIS — F1011 Alcohol abuse, in remission: Secondary | ICD-10-CM | POA: Diagnosis present

## 2018-11-30 DIAGNOSIS — K7460 Unspecified cirrhosis of liver: Secondary | ICD-10-CM | POA: Diagnosis present

## 2018-11-30 DIAGNOSIS — D696 Thrombocytopenia, unspecified: Secondary | ICD-10-CM | POA: Diagnosis present

## 2018-11-30 DIAGNOSIS — F1010 Alcohol abuse, uncomplicated: Secondary | ICD-10-CM | POA: Diagnosis present

## 2018-11-30 DIAGNOSIS — K859 Acute pancreatitis without necrosis or infection, unspecified: Secondary | ICD-10-CM | POA: Diagnosis present

## 2018-11-30 DIAGNOSIS — D6959 Other secondary thrombocytopenia: Secondary | ICD-10-CM | POA: Diagnosis present

## 2018-11-30 DIAGNOSIS — F17210 Nicotine dependence, cigarettes, uncomplicated: Secondary | ICD-10-CM | POA: Diagnosis present

## 2018-11-30 DIAGNOSIS — K861 Other chronic pancreatitis: Secondary | ICD-10-CM | POA: Diagnosis present

## 2018-11-30 DIAGNOSIS — E860 Dehydration: Secondary | ICD-10-CM | POA: Diagnosis present

## 2018-11-30 DIAGNOSIS — K863 Pseudocyst of pancreas: Secondary | ICD-10-CM | POA: Diagnosis present

## 2018-11-30 DIAGNOSIS — R7401 Elevation of levels of liver transaminase levels: Secondary | ICD-10-CM | POA: Diagnosis present

## 2018-11-30 DIAGNOSIS — R1110 Vomiting, unspecified: Secondary | ICD-10-CM | POA: Insufficient documentation

## 2018-11-30 DIAGNOSIS — K852 Alcohol induced acute pancreatitis without necrosis or infection: Principal | ICD-10-CM | POA: Diagnosis present

## 2018-11-30 LAB — COMPREHENSIVE METABOLIC PANEL
ALT: 29 U/L (ref 0–44)
AST: 151 U/L — ABNORMAL HIGH (ref 15–41)
Albumin: 3.5 g/dL (ref 3.5–5.0)
Alkaline Phosphatase: 134 U/L — ABNORMAL HIGH (ref 38–126)
Anion gap: 12 (ref 5–15)
BUN: 11 mg/dL (ref 6–20)
CO2: 26 mmol/L (ref 22–32)
Calcium: 8.8 mg/dL — ABNORMAL LOW (ref 8.9–10.3)
Chloride: 93 mmol/L — ABNORMAL LOW (ref 98–111)
Creatinine, Ser: 0.69 mg/dL (ref 0.61–1.24)
GFR calc Af Amer: >60 mL/min (ref >60)
GFR calc non Af Amer: >60 mL/min (ref >60)
Glucose, Bld: 85 mg/dL (ref 70–99)
Potassium: 4.3 mmol/L (ref 3.5–5.1)
Sodium: 131 mmol/L — ABNORMAL LOW (ref 135–145)
Total Bilirubin: 2.7 mg/dL — ABNORMAL HIGH (ref 0.3–1.2)
Total Protein: 8.7 g/dL — ABNORMAL HIGH (ref 6.5–8.1)

## 2018-11-30 LAB — CBC
HCT: 46.2 % (ref 39.0–52.0)
Hemoglobin: 15 g/dL (ref 13.0–17.0)
MCH: 30.9 pg (ref 26.0–34.0)
MCHC: 32.5 g/dL (ref 30.0–36.0)
MCV: 95.1 fL (ref 80.0–100.0)
Platelets: 83 10*3/uL — ABNORMAL LOW (ref 150–400)
RBC: 4.86 MIL/uL (ref 4.22–5.81)
RDW: 13.7 % (ref 11.5–15.5)
WBC: 6.5 10*3/uL (ref 4.0–10.5)
nRBC: 0 % (ref 0.0–0.2)

## 2018-11-30 LAB — URINALYSIS, ROUTINE W REFLEX MICROSCOPIC
Bacteria, UA: NONE SEEN
Bilirubin Urine: NEGATIVE
Glucose, UA: NEGATIVE mg/dL
Hgb urine dipstick: NEGATIVE
Ketones, ur: 80 mg/dL — AB
Leukocytes,Ua: NEGATIVE
Nitrite: NEGATIVE
Protein, ur: 30 mg/dL — AB
Specific Gravity, Urine: 1.018 (ref 1.005–1.030)
pH: 7 (ref 5.0–8.0)

## 2018-11-30 LAB — LIPASE, BLOOD: Lipase: 556 U/L — ABNORMAL HIGH (ref 11–51)

## 2018-11-30 LAB — ETHANOL: Alcohol, Ethyl (B): <10 mg/dL (ref <10)

## 2018-11-30 MED ORDER — SODIUM CHLORIDE 0.9 % IV BOLUS
1000.0000 mL | Freq: Once | INTRAVENOUS | Status: AC
  Administered 2018-11-30: 1000 mL via INTRAVENOUS

## 2018-11-30 MED ORDER — LORAZEPAM 2 MG/ML IJ SOLN
0.0000 mg | Freq: Four times a day (QID) | INTRAMUSCULAR | Status: DC
  Administered 2018-12-01: 1 mg via INTRAVENOUS
  Filled 2018-11-30: qty 1

## 2018-11-30 MED ORDER — IOHEXOL 300 MG/ML  SOLN
30.0000 mL | Freq: Once | INTRAMUSCULAR | Status: AC | PRN
  Administered 2018-11-30: 30 mL via ORAL

## 2018-11-30 MED ORDER — ONDANSETRON HCL 4 MG/2ML IJ SOLN
4.0000 mg | Freq: Once | INTRAMUSCULAR | Status: AC
  Administered 2018-11-30: 4 mg via INTRAVENOUS
  Filled 2018-11-30: qty 2

## 2018-11-30 MED ORDER — LORAZEPAM 1 MG PO TABS: 1.0000 mg | ORAL_TABLET | ORAL | Status: DC | PRN

## 2018-11-30 MED ORDER — VITAMIN B-1 100 MG PO TABS: 100.0000 mg | ORAL_TABLET | Freq: Every day | ORAL | Status: DC

## 2018-11-30 MED ORDER — SODIUM CHLORIDE 0.9% FLUSH
3.0000 mL | Freq: Once | INTRAVENOUS | Status: AC
  Administered 2018-11-30: 3 mL via INTRAVENOUS

## 2018-11-30 MED ORDER — LORAZEPAM 2 MG/ML IJ SOLN: 1.0000 mg | INTRAMUSCULAR | Status: DC | PRN

## 2018-11-30 MED ORDER — ONDANSETRON HCL 4 MG/2ML IJ SOLN: 4.0000 mg | Freq: Four times a day (QID) | INTRAMUSCULAR | Status: DC | PRN

## 2018-11-30 MED ORDER — LACTATED RINGERS IV SOLN
INTRAVENOUS | Status: AC
  Administered 2018-12-01 (×4): via INTRAVENOUS

## 2018-11-30 MED ORDER — IOHEXOL 300 MG/ML  SOLN
100.0000 mL | Freq: Once | INTRAMUSCULAR | Status: AC | PRN
  Administered 2018-11-30: 100 mL via INTRAVENOUS

## 2018-11-30 MED ORDER — FENTANYL CITRATE (PF) 100 MCG/2ML IJ SOLN
50.0000 ug | Freq: Once | INTRAMUSCULAR | Status: AC
  Administered 2018-11-30: 50 ug via INTRAVENOUS
  Filled 2018-11-30: qty 2

## 2018-11-30 MED ORDER — FOLIC ACID 1 MG PO TABS
1.0000 mg | ORAL_TABLET | Freq: Every day | ORAL | Status: DC
  Administered 2018-12-01 – 2018-12-03 (×3): 1 mg via ORAL
  Filled 2018-11-30 (×3): qty 1

## 2018-11-30 MED ORDER — SODIUM CHLORIDE (PF) 0.9 % IJ SOLN
INTRAMUSCULAR | Status: AC
  Filled 2018-11-30: qty 50

## 2018-11-30 MED ORDER — SODIUM CHLORIDE 0.9 % IV SOLN: Freq: Once | INTRAVENOUS | Status: DC

## 2018-11-30 MED ORDER — LORAZEPAM 2 MG/ML IJ SOLN: 0.0000 mg | Freq: Two times a day (BID) | INTRAMUSCULAR | Status: DC

## 2018-11-30 MED ORDER — THIAMINE HCL 100 MG/ML IJ SOLN: 100.0000 mg | Freq: Every day | INTRAMUSCULAR | Status: DC

## 2018-11-30 MED ORDER — MORPHINE SULFATE (PF) 2 MG/ML IV SOLN
1.0000 mg | INTRAVENOUS | Status: DC | PRN
  Administered 2018-12-01 – 2018-12-02 (×2): 1 mg via INTRAVENOUS
  Filled 2018-11-30 (×3): qty 1

## 2018-11-30 MED ORDER — ONDANSETRON HCL 4 MG PO TABS: 4.0000 mg | ORAL_TABLET | Freq: Four times a day (QID) | ORAL | Status: DC | PRN

## 2018-11-30 MED ORDER — ADULT MULTIVITAMIN W/MINERALS CH
1.0000 | ORAL_TABLET | Freq: Every day | ORAL | Status: DC
  Administered 2018-12-01 – 2018-12-03 (×3): 1 via ORAL
  Filled 2018-11-30 (×3): qty 1

## 2018-11-30 NOTE — ED Triage Notes (Signed)
Pt arrives from [facility] for abdominal pain. Patient was found on the side of the road with emesis and abdominal pain.  Patient reports he smokes and drinks but thinks his last meal made him sick. Patient is alert and oriented and Burmese interpreter used for information obtained.

## 2018-11-30 NOTE — ED Triage Notes (Signed)
Patient found on the side of the road. Patient reports RLQ pain to EMS but is not english speaking. Patient has no identification and is not communicating what language he speaks.

## 2018-11-30 NOTE — H&P (Signed)
History and Physical    Cole Ward ZOX:[id number] DOB: 10/21/1970 DOA: 11/30/2018  PCP: Julieanne MansonMulberry, Elizabeth, MD  Patient coming from: Home.  Burmese translation used.  Chief Complaint: Abdominal pain.  HPI: Cole Ward is a 48 y.o. male with with a history of recurrent alcohol induced pancreatitis with pseudocyst presents to the ER because of severe abdominal pain involving the epigastric area with no radiation no associated chest pain shortness of breath nausea vomiting or diarrhea.  Pain is mostly epigastric severe in nature stabbing.   ED Course: In the ER patient was afebrile COVID-19 test was negative.  CT abdomen pelvis shows features concerning for pancreatitis with a hypodensity seen which will need MRI once patient's pancreatitis improved.  The previous pseudocyst is improved but there is another pseudocyst developing.  No definite signs of any infection.  Labs show sodium 131 creatinine 0.6 WBC 6.5 hemoglobin 15 platelets 83 lipase was 556 AST 151 ALT 29 total bilirubin 2.7 Albumin 3.5 urine drug screen is negative.  UA shows ketones 80.  Review of Systems: As per HPI, rest all negative.   Past Medical History:  Diagnosis Date  . Alcohol-induced pancreatitis   . Tobacco abuse   . Vision decreased     Past Surgical History:  Procedure Laterality Date  . ESOPHAGOGASTRODUODENOSCOPY N/A 07/07/2014   Procedure: ESOPHAGOGASTRODUODENOSCOPY (EGD);  Surgeon: Jeani HawkingPatrick Hung, MD;  [facility];  Service: Endoscopy;  Laterality: N/A;     reports that he has been smoking cigarettes. He has a 8.75 pack-year smoking history. He has never used smokeless tobacco. He reports current alcohol use of about 3.0 standard drinks of alcohol per week. He reports that he does not use drugs.  No Known Allergies  History reviewed. No pertinent family history.  Prior to Admission medications   Not on File    Physical Exam: Constitutional: Moderately built and nourished. Vitals:   11/30/18  1815 11/30/18 1829 11/30/18 2000 11/30/18 2029  BP: (!) 143/103  (!) 134/96 130/90  Pulse: 83  85 85  Resp: (!) 22  (!) 29 16  Temp:  98.2 F (36.8 C)    SpO2: 97%  98% 99%   Eyes: Anicteric no pallor. ENMT: No discharge from the ears eyes nose or mouth. Neck: No mass felt.  No neck rigidity. Respiratory: No rhonchi or crepitations. Cardiovascular: S1-S2 heard. Abdomen: Soft mild epigastric tenderness no guarding or rigidity. Musculoskeletal: No edema. Skin: No rash. Neurologic: Alert awake oriented to time place and person.  Moves all extremities. Psychiatric: Appears normal.   Labs on Admission: I have personally reviewed following labs and imaging studies  CBC: Recent Labs  Lab 11/30/18 1346  WBC 6.5  HGB 15.0  HCT 46.2  MCV 95.1  PLT 83*   Basic Metabolic Panel: Recent Labs  Lab 11/30/18 1526  NA 131*  K 4.3  CL 93*  CO2 26  GLUCOSE 85  BUN 11  CREATININE 0.69  CALCIUM 8.8*   GFR: CrCl cannot be calculated (Unknown ideal weight.). Liver Function Tests: Recent Labs  Lab 11/30/18 1526  AST 151*  ALT 29  ALKPHOS 134*  BILITOT 2.7*  PROT 8.7*  ALBUMIN 3.5   Recent Labs  Lab 11/30/18 1526  LIPASE 556*   No results for input(s): AMMONIA in the last 168 hours. Coagulation Profile: No results for input(s): INR, PROTIME in the last 168 hours. Cardiac Enzymes: No results for input(s): CKTOTAL, CKMB, CKMBINDEX, TROPONINI in the last 168 hours. BNP (last 3 results) No  results for input(s): PROBNP in the last 8760 hours. HbA1C: No results for input(s): HGBA1C in the last 72 hours. CBG: No results for input(s): GLUCAP in the last 168 hours. Lipid Profile: No results for input(s): CHOL, HDL, LDLCALC, TRIG, CHOLHDL, LDLDIRECT in the last 72 hours. Thyroid Function Tests: No results for input(s): TSH, T4TOTAL, FREET4, T3FREE, THYROIDAB in the last 72 hours. Anemia Panel: No results for input(s): VITAMINB12, FOLATE, FERRITIN, TIBC, IRON, RETICCTPCT in  the last 72 hours. Urine analysis:    Component Value Date/Time   COLORURINE AMBER (A) 11/30/2018 1641   APPEARANCEUR CLEAR 11/30/2018 1641   LABSPEC 1.018 11/30/2018 1641   PHURINE 7.0 11/30/2018 1641   GLUCOSEU NEGATIVE 11/30/2018 1641   HGBUR NEGATIVE 11/30/2018 1641   BILIRUBINUR NEGATIVE 11/30/2018 1641   KETONESUR 80 (A) 11/30/2018 1641   PROTEINUR 30 (A) 11/30/2018 1641   UROBILINOGEN 1.0 07/05/2014 2059   NITRITE NEGATIVE 11/30/2018 1641   LEUKOCYTESUR NEGATIVE 11/30/2018 1641   Sepsis Labs: @LABRCNTIP (procalcitonin:4,lacticidven:4) )No results found for this or any previous visit (from the past 240 hour(s)).   Radiological Exams on Admission: Ct Abdomen Pelvis W Contrast  Result Date: 11/30/2018 CLINICAL DATA:  Epigastric pain elevated LFT EXAM: CT ABDOMEN AND PELVIS WITH CONTRAST TECHNIQUE: Multidetector CT imaging of the abdomen and pelvis was performed using the standard protocol following bolus administration of intravenous contrast. CONTRAST:  75mL OMNIPAQUE IOHEXOL 300 MG/ML SOLN, 137mL OMNIPAQUE IOHEXOL 300 MG/ML SOLN COMPARISON:  CT 05/16/2018, 10/05/2017 FINDINGS: Lower chest: Lung bases demonstrate no acute consolidation or effusion. The heart size is normal. Hepatobiliary: Hepatic steatosis. Possible subtle contour nodularity of the anterior liver. Tortuous gallbladder without calcified stone. No biliary dilatation Pancreas: Generalized peripancreatic edema, suspect for pancreatitis. 16 mm indeterminate hypodensity in the region of uncinate process, prior pseudocyst noted in the region on May 16, 2018. Hypodense lesion at the neck of the pancreas measuring 2.1 cm, previously 2 cm. 14 mm peripancreatic cystic lesion adjacent to uncinate process. Spleen: Normal in size without focal abnormality. Adrenals/Urinary Tract: Adrenal glands are within normal limits. No hydronephrosis. Slightly thick-walled urinary bladder Stomach/Bowel: Stomach is nonenlarged. Fluid and edema  surrounding the second and third portion of duodenum. No colon wall thickening. Vascular/Lymphatic: Nonaneurysmal aorta. Aortic atherosclerosis. No significant adenopathy Reproductive: Prostate is unremarkable. Other: Negative for free air Musculoskeletal: No acute or significant osseous findings. IMPRESSION: 1. Generalized peripancreatic soft tissue stranding and edema consistent with acute pancreatitis. Decreased size of previously noted pseudocyst at the uncinate process/posterior pancreatic head with residual 16 mm intermediate density in the region. New 14 mm peripancreatic cystic lesion adjacent to uncinate process, possible small pseudocysts. Persistent but unchanged size of indeterminate hypodense lesion at the neck of the pancreas, for which MRI evaluation is suggested after resolution of acute symptoms. 2. Edema and inflammatory change about the duodenum, either due to duodenitis or reactive inflammatory change from pancreatitis 3. Hepatic steatosis. Suspicion of subtle contour nodularity of the anterior liver as may be seen with cirrhosis. Electronically Signed   By: Donavan Foil M.D.   On: 11/30/2018 20:51     Assessment/Plan Principal Problem:   Acute pancreatitis Active Problems:   Transaminitis   Alcohol abuse   Thrombocytopenia (HCC)    1. Acute pancreatitis likely alcohol induced -CT scan shows a hypodensity and also a new peripancreatic cystic lesion for which MRI will should be done after resolution of the acute symptoms.  We will keep patient n.p.o. IV fluids pain relief medications. 2. History of alcohol abuse -  patient states he has not had any alcohol for last 1 year.  Closely monitor.  Thiamine. 3. Possible liver cirrhosis closely observe. 4. Thrombocytopenia likely from cirrhosis.  Follow CBC appears to be chronic.  Given that patient has acute pancreatitis and will need close monitoring and management will need more than 2 midnight stay in inpatient status.   DVT  prophylaxis: SCDs since patient has thrombocytopenia. Code Status: Full code. Family Communication: Discussed with patient. Disposition Plan: To be determined. Consults called: None. Admission status: Inpatient.   Eduard Clos MD [facility] Pager [phone].  If 7PM-7AM, please contact night-coverage [email or web address] Password [facility]  11/30/2018, 10:37 PM

## 2018-12-01 ENCOUNTER — Other Ambulatory Visit: Payer: Self-pay

## 2018-12-01 DIAGNOSIS — F1010 Alcohol abuse, uncomplicated: Secondary | ICD-10-CM

## 2018-12-01 LAB — COMPREHENSIVE METABOLIC PANEL
ALT: 26 U/L (ref 0–44)
AST: 140 U/L — ABNORMAL HIGH (ref 15–41)
Albumin: 3 g/dL — ABNORMAL LOW (ref 3.5–5.0)
Alkaline Phosphatase: 116 U/L (ref 38–126)
Anion gap: 11 (ref 5–15)
BUN: 10 mg/dL (ref 6–20)
CO2: 23 mmol/L (ref 22–32)
Calcium: 8.3 mg/dL — ABNORMAL LOW (ref 8.9–10.3)
Chloride: 96 mmol/L — ABNORMAL LOW (ref 98–111)
Creatinine, Ser: 0.64 mg/dL (ref 0.61–1.24)
GFR calc Af Amer: >60 mL/min (ref >60)
GFR calc non Af Amer: >60 mL/min (ref >60)
Glucose, Bld: 90 mg/dL (ref 70–99)
Potassium: 3.5 mmol/L (ref 3.5–5.1)
Sodium: 130 mmol/L — ABNORMAL LOW (ref 135–145)
Total Bilirubin: 2.5 mg/dL — ABNORMAL HIGH (ref 0.3–1.2)
Total Protein: 7.8 g/dL (ref 6.5–8.1)

## 2018-12-01 LAB — RAPID URINE DRUG SCREEN, HOSP PERFORMED
Amphetamines: NOT DETECTED
Barbiturates: NOT DETECTED
Benzodiazepines: NOT DETECTED
Cocaine: NOT DETECTED
Opiates: NOT DETECTED
Tetrahydrocannabinol: NOT DETECTED

## 2018-12-01 LAB — GLUCOSE, CAPILLARY
Glucose-Capillary: 81 mg/dL (ref 70–99)
Glucose-Capillary: 78 mg/dL (ref 70–99)
Glucose-Capillary: 87 mg/dL (ref 70–99)
Glucose-Capillary: 77 mg/dL (ref 70–99)
Glucose-Capillary: 70 mg/dL (ref 70–99)

## 2018-12-01 LAB — SARS CORONAVIRUS 2 (TAT 6-24 HRS): SARS Coronavirus 2: NEGATIVE

## 2018-12-01 LAB — CBG MONITORING, ED: Glucose-Capillary: 86 mg/dL (ref 70–99)

## 2018-12-01 LAB — BILIRUBIN, DIRECT: Bilirubin, Direct: 0.6 mg/dL — ABNORMAL HIGH (ref 0.0–0.2)

## 2018-12-01 LAB — CBC
HCT: 42.9 % (ref 39.0–52.0)
Hemoglobin: 14.1 g/dL (ref 13.0–17.0)
MCH: 31.4 pg (ref 26.0–34.0)
MCHC: 32.9 g/dL (ref 30.0–36.0)
MCV: 95.5 fL (ref 80.0–100.0)
Platelets: 62 10*3/uL — ABNORMAL LOW (ref 150–400)
RBC: 4.49 MIL/uL (ref 4.22–5.81)
RDW: 13.9 % (ref 11.5–15.5)
WBC: 4.7 10*3/uL (ref 4.0–10.5)
nRBC: 0 % (ref 0.0–0.2)

## 2018-12-01 LAB — PHOSPHORUS: Phosphorus: 3 mg/dL (ref 2.5–4.6)

## 2018-12-01 LAB — AMMONIA: Ammonia: 48 umol/L — ABNORMAL HIGH (ref 9–35)

## 2018-12-01 LAB — HIV ANTIBODY (ROUTINE TESTING W REFLEX): HIV Screen 4th Generation wRfx: NONREACTIVE

## 2018-12-01 LAB — MAGNESIUM: Magnesium: 1.4 mg/dL — ABNORMAL LOW (ref 1.7–2.4)

## 2018-12-01 MED ORDER — THIAMINE HCL 100 MG/ML IJ SOLN
100.0000 mg | Freq: Every day | INTRAMUSCULAR | Status: DC
  Administered 2018-12-01 – 2018-12-03 (×3): 100 mg via INTRAVENOUS
  Filled 2018-12-01 (×3): qty 2

## 2018-12-01 MED ORDER — POTASSIUM CHLORIDE 10 MEQ/100ML IV SOLN
10.0000 meq | INTRAVENOUS | Status: AC
  Administered 2018-12-01 (×2): 10 meq via INTRAVENOUS
  Filled 2018-12-01 (×2): qty 100

## 2018-12-01 MED ORDER — PANTOPRAZOLE SODIUM 40 MG IV SOLR
40.0000 mg | Freq: Every day | INTRAVENOUS | Status: DC
  Administered 2018-12-01 – 2018-12-03 (×3): 40 mg via INTRAVENOUS
  Filled 2018-12-01 (×3): qty 40

## 2018-12-01 MED ORDER — MAGNESIUM SULFATE 2 GM/50ML IV SOLN
2.0000 g | Freq: Once | INTRAVENOUS | Status: AC
  Administered 2018-12-01: 2 g via INTRAVENOUS
  Filled 2018-12-01 (×2): qty 50

## 2018-12-01 MED ORDER — DEXTROSE 50 % IV SOLN
25.0000 mL | Freq: Once | INTRAVENOUS | Status: DC
  Filled 2018-12-01: qty 50

## 2018-12-01 NOTE — Progress Notes (Signed)
PROGRESS NOTE  Cole Ward  DOB: Jun 17, 1970  PCP: Julieanne Manson, MD LEX:[id number]  DOA: 11/30/2018  LOS: 1 day   Brief narrative: Patient is a 48 y.o. male with with a history of recurrent alcohol induced pancreatitis with pseudocyst who presented to the ED on 10/14 because of severe abdominal pain involving the epigastric area with no radiation and sent no associated chest pain shortness of breath nausea vomiting or diarrhea.  Pain is mostly epigastric severe in nature stabbing.   In the ER patient was afebrile COVID-19 test was negative.  CT abdomen pelvis shows features concerning for pancreatitis with a hypodensity seen which will need MRI once patient's pancreatitis improved.  The previous pseudocyst is improved but there is another pseudocyst developing.  No definite signs of any infection.  Labs show sodium 131 creatinine 0.6 WBC 6.5 hemoglobin 15 platelets 83 lipase was 556 AST 151 ALT 29 total bilirubin 2.7 Albumin 3.5 urine drug screen is negative.  UA shows ketones 80.  Subjective: Patient was seen and examined this morning.  Middle-aged male of [place] origin.  Lying in bed.  At the time of my evaluation, patient was able to open eye on sternal rub.  I checked with the nurse later, patient was much more alert and awake.  He is not complaining of any pain today.  Assessment/Plan: Acute pancreatitis likely alcohol induced - CT scan shows a hypodensity and also a new peripancreatic cystic lesion for which MRI will be done after resolution of the acute symptoms.  Patient currently is n.p.o.  Continue IV fluids pain relief medications. Plan to start on clear liquids tomorrow.  Repeat lipase in the morning.  History of alcohol abuse - patient states he has not had any alcohol for last 1 year.  Closely monitor.  Continue  thiamine. Possible liver cirrhosis closely observe.  Thrombocytopenia likely from cirrhosis.  Follow CBC appears to be chronic.  Body mass index is 21.4 kg/m.  Mobility: Encourage ambulation DVT prophylaxis:  SCDs because of thrombocytopenia Code Status:   Code Status: Full Code  Family Communication:  Not available Expected Discharge:  Anticipate discharge home in next 1 to 2 days.  Consultants:  none  Procedures:  none  Antimicrobials: Anti-infectives (From admission, onward)   None      Diet Order            Diet NPO time specified  Diet effective now              Infusions:  . lactated ringers 150 mL/hr at 12/01/18 1029    Scheduled Meds: . folic acid  1 mg Oral Daily  . multivitamin with minerals  1 tablet Oral Daily  . pantoprazole (PROTONIX) IV  40 mg Intravenous Daily  . thiamine injection  100 mg Intravenous Daily    PRN meds: morphine injection, ondansetron **OR** ondansetron (ZOFRAN) IV   Objective: Vitals:   12/01/18 0416 12/01/18 0452  BP: 108/81 110/73  Pulse: 86 84  Resp:  16  Temp:  97.7 F (36.5 C)  SpO2:  100%    Intake/Output Summary (Last 24 hours) at 12/01/2018 1431 Last data filed at 12/01/2018 0519 Gross per 24 hour  Intake 205 ml  Output 0 ml  Net 205 ml   Filed Weights   12/01/18 0500  Weight: 53.1 kg   Weight change:  Body mass index is 21.4 kg/m.   Physical Exam: General exam: Thin built male. Skin: No rashes, lesions or ulcers. HEENT: Atraumatic, normocephalic, supple neck,  no obvious bleeding Lungs: Clear to auscultation bilaterally CVS: Regular rate and rhythm, normal GI/Abd soft, mild tenderness in the epigastric present, nondistended, positive present CNS: Under the effect of sedation and pain medicine at the time of my evaluation.  Improved alertness during the day. Psychiatry: -Mood appropriate. Extremities: No pedal edema, no calf tenderness  Data Review: I have personally reviewed the laboratory data and studies available.  Recent Labs  Lab 11/30/18 1346 12/01/18 0452  WBC 6.5 4.7  HGB 15.0 14.1  HCT 46.2 42.9  MCV 95.1 95.5  PLT 83* 62*   Recent  Labs  Lab 11/30/18 1526 12/01/18 0452  NA 131* 130*  K 4.3 3.5  CL 93* 96*  CO2 26 23  GLUCOSE 85 90  BUN 11 10  CREATININE 0.69 0.64  CALCIUM 8.8* 8.3*  MG  --  1.4*  PHOS  --  3.0    Terrilee Croak, MD  [facility] 12/01/2018

## 2018-12-01 NOTE — ED Notes (Signed)
ED TO INPATIENT HANDOFF REPORT  ED Nurse Name and Phone #: jon wled   S Name/Age/Gender Cole Ward 48 y.o. male Room/Bed: WA10/WA10  Code Status   Code Status: Full Code  Home/SNF/Other Home {Patient oriented confused and language barrier speaks [place]   Is this baseline?  No   Triage Complete: Triage complete  Chief Complaint Abdominal Pain  Triage Note Pt arrives from [facility] for abdominal pain. Patient was found on the side of the road with emesis and abdominal pain.  Patient reports he smokes and drinks but thinks his last meal made him sick. Patient is alert and oriented and [place] interpreter used for information obtained.    Allergies No Known Allergies  Level of Care/Admitting Diagnosis ED Disposition    ED Disposition Condition Comment   Admit  Hospital Area: [facility] [[id number]]  Level of Care: Telemetry [5]  Admit to tele based on following criteria: Monitor for Ischemic changes  Covid Evaluation: Asymptomatic Screening Protocol (No Symptoms)  Diagnosis: Acute pancreatitis [577.0.ICD-9-CM]  Admitting Physician: Eduard Clos [[id number]]  Attending Physician: Eduard Clos [phone]  Estimated length of stay: past midnight tomorrow  Certification:: I certify this patient will need inpatient services for at least 2 midnights  PT Class (Do Not Modify): Inpatient [101]  PT Acc Code (Do Not Modify): Private [1]       B Medical/Surgery History Past Medical History:  Diagnosis Date  . Alcohol-induced pancreatitis   . Tobacco abuse   . Vision decreased    Past Surgical History:  Procedure Laterality Date  . ESOPHAGOGASTRODUODENOSCOPY N/A 07/07/2014   Procedure: ESOPHAGOGASTRODUODENOSCOPY (EGD);  Surgeon: Jeani Hawking, MD;  Location: [facility] ENDOSCOPY;  Service: Endoscopy;  Laterality: N/A;     A IV Location/Drains/Wounds Patient Lines/Drains/Airways Status   Active Line/Drains/Airways    Name:   Placement date:   Placement time:    Site:   Days:   Peripheral IV 11/30/18 Left Antecubital   11/30/18    1639    Antecubital   1          Intake/Output Last 24 hours No intake or output data in the 24 hours ending 12/01/18 0336  Labs/Imaging Results for orders placed or performed during the hospital encounter of 11/30/18 (from the past 48 hour(s))  CBC     Status: Abnormal   Collection Time: 11/30/18  1:46 PM  Result Value Ref Range   WBC 6.5 4.0 - 10.5 K/uL   RBC 4.86 4.22 - 5.81 MIL/uL   Hemoglobin 15.0 13.0 - 17.0 g/dL   HCT 06.3 01.6 - 01.0 %   MCV 95.1 80.0 - 100.0 fL   MCH 30.9 26.0 - 34.0 pg   MCHC 32.5 30.0 - 36.0 g/dL   RDW 93.2 35.5 - 73.2 %   Platelets 83 (L) 150 - 400 K/uL    Comment: REPEATED TO VERIFY PLATELET COUNT CONFIRMED BY SMEAR SPECIMEN CHECKED FOR CLOTS Immature Platelet Fraction may be clinically indicated, consider ordering this additional test [id number]    nRBC 0.0 0.0 - 0.2 %    Comment: Performed at [facility], [address] [place], [place] [address]  Comprehensive metabolic panel     Status: Abnormal   Collection Time: 11/30/18  3:26 PM  Result Value Ref Range   Sodium 131 (L) 135 - 145 mmol/L   Potassium 4.3 3.5 - 5.1 mmol/L   Chloride 93 (L) 98 - 111 mmol/L   CO2 26 22 - 32 mmol/L  Glucose, Bld 85 70 - 99 mg/dL   BUN 11 6 - 20 mg/dL   Creatinine, Ser 1.61 0.61 - 1.24 mg/dL   Calcium 8.8 (L) 8.9 - 10.3 mg/dL   Total Protein 8.7 (H) 6.5 - 8.1 g/dL   Albumin 3.5 3.5 - 5.0 g/dL   AST 096 (H) 15 - 41 U/L   ALT 29 0 - 44 U/L   Alkaline Phosphatase 134 (H) 38 - 126 U/L   Total Bilirubin 2.7 (H) 0.3 - 1.2 mg/dL   GFR calc non Af Amer >60 >60 mL/min   GFR calc Af Amer >60 >60 mL/min   Anion gap 12 5 - 15    Comment: Performed at [facility], [address] [place], [place] [address]  Lipase, blood     Status: Abnormal   Collection Time: 11/30/18  3:26 PM  Result Value Ref Range   Lipase 556 (H) 11 - 51 U/L    Comment: RESULTS  CONFIRMED BY MANUAL DILUTION Performed at [facility], [address], [place], [place] [address]   Urinalysis, Routine w reflex microscopic     Status: Abnormal   Collection Time: 11/30/18  4:41 PM  Result Value Ref Range   Color, Urine AMBER (A) YELLOW    Comment: BIOCHEMICALS MAY BE AFFECTED BY COLOR   APPearance CLEAR CLEAR   Specific Gravity, Urine 1.018 1.005 - 1.030   pH 7.0 5.0 - 8.0   Glucose, UA NEGATIVE NEGATIVE mg/dL   Hgb urine dipstick NEGATIVE NEGATIVE   Bilirubin Urine NEGATIVE NEGATIVE   Ketones, ur 80 (A) NEGATIVE mg/dL   Protein, ur 30 (A) NEGATIVE mg/dL   Nitrite NEGATIVE NEGATIVE   Leukocytes,Ua NEGATIVE NEGATIVE   RBC / HPF 0-5 0 - 5 RBC/hpf   WBC, UA 0-5 0 - 5 WBC/hpf   Bacteria, UA NONE SEEN NONE SEEN   Squamous Epithelial / LPF 0-5 0 - 5   Mucus PRESENT     Comment: Performed at [facility], [address], [place], [place] [address]  Ethanol     Status: None   Collection Time: 11/30/18  4:41 PM  Result Value Ref Range   Alcohol, Ethyl (B) <10 <10 mg/dL    Comment: (NOTE) Lowest detectable limit for serum alcohol is 10 mg/dL. For medical purposes only. Performed at [facility], [address] [place], [place] [address]   SARS CORONAVIRUS 2 (TAT 6-24 HRS) Nasopharyngeal Nasopharyngeal Swab     Status: None   Collection Time: 11/30/18  6:30 PM   Specimen: Nasopharyngeal Swab  Result Value Ref Range   SARS Coronavirus 2 NEGATIVE NEGATIVE    Comment: (NOTE) SARS-CoV-2 target nucleic acids are NOT DETECTED. The SARS-CoV-2 RNA is generally detectable in upper and lower respiratory specimens during the acute phase of infection. Negative results do not preclude SARS-CoV-2 infection, do not rule out co-infections with other pathogens, and should not be used as the sole basis for treatment or other patient management decisions. Negative results must be combined with clinical  observations, patient history, and epidemiological information. The expected result is Negative. Fact Sheet for Patients: HairSlick.no Fact Sheet for Healthcare Providers: quierodirigir.com This test is not yet approved or cleared by the [place] FDA and  has been authorized for detection and/or diagnosis of SARS-CoV-2 by FDA under an Emergency Use Authorization (EUA). This EUA will remain  in effect (meaning this test can be used) for the duration of the COVID-19 declaration  under Section 56 4(b)(1) of the Act, 21 U.S.C. section 360bbb-3(b)(1), unless the authorization is terminated or revoked sooner. Performed at [facility], [place] [address] [place], [place] [address]   CBG monitoring, ED     Status: None   Collection Time: 12/01/18 12:57 AM  Result Value Ref Range   Glucose-Capillary 86 70 - 99 mg/dL   Ct Abdomen Pelvis W Contrast  Result Date: 11/30/2018 CLINICAL DATA:  Epigastric pain elevated LFT EXAM: CT ABDOMEN AND PELVIS WITH CONTRAST TECHNIQUE: Multidetector CT imaging of the abdomen and pelvis was performed using the standard protocol following bolus administration of intravenous contrast. CONTRAST:  53mL OMNIPAQUE IOHEXOL 300 MG/ML SOLN, 178mL OMNIPAQUE IOHEXOL 300 MG/ML SOLN COMPARISON:  CT 05/16/2018, 10/05/2017 FINDINGS: Lower chest: Lung bases demonstrate no acute consolidation or effusion. The heart size is normal. Hepatobiliary: Hepatic steatosis. Possible subtle contour nodularity of the anterior liver. Tortuous gallbladder without calcified stone. No biliary dilatation Pancreas: Generalized peripancreatic edema, suspect for pancreatitis. 16 mm indeterminate hypodensity in the region of uncinate process, prior pseudocyst noted in the region on May 16, 2018. Hypodense lesion at the neck of the pancreas measuring 2.1 cm, previously 2 cm. 14 mm peripancreatic cystic lesion adjacent to uncinate process.  Spleen: Normal in size without focal abnormality. Adrenals/Urinary Tract: Adrenal glands are within normal limits. No hydronephrosis. Slightly thick-walled urinary bladder Stomach/Bowel: Stomach is nonenlarged. Fluid and edema surrounding the second and third portion of duodenum. No colon wall thickening. Vascular/Lymphatic: Nonaneurysmal aorta. Aortic atherosclerosis. No significant adenopathy Reproductive: Prostate is unremarkable. Other: Negative for free air Musculoskeletal: No acute or significant osseous findings. IMPRESSION: 1. Generalized peripancreatic soft tissue stranding and edema consistent with acute pancreatitis. Decreased size of previously noted pseudocyst at the uncinate process/posterior pancreatic head with residual 16 mm intermediate density in the region. New 14 mm peripancreatic cystic lesion adjacent to uncinate process, possible small pseudocysts. Persistent but unchanged size of indeterminate hypodense lesion at the neck of the pancreas, for which MRI evaluation is suggested after resolution of acute symptoms. 2. Edema and inflammatory change about the duodenum, either due to duodenitis or reactive inflammatory change from pancreatitis 3. Hepatic steatosis. Suspicion of subtle contour nodularity of the anterior liver as may be seen with cirrhosis. Electronically Signed   By: Donavan Foil M.D.   On: 11/30/2018 20:51    Pending Labs Unresulted Labs (From admission, onward)    Start     Ordered   12/01/18 0500  HIV Antibody (routine testing w rflx)  (HIV Antibody (Routine testing w reflex) panel)  Tomorrow morning,   R     11/30/18 2237   12/01/18 0500  HIV4GL Save Tube  (HIV Antibody (Routine testing w reflex) panel)  Tomorrow morning,   R     11/30/18 2237   12/01/18 3474  Basic metabolic panel  Tomorrow morning,   R     11/30/18 2237   12/01/18 0500  CBC  Tomorrow morning,   R     11/30/18 2237   12/01/18 0500  Hepatic function panel  Tomorrow morning,   R     11/30/18 2237    11/30/18 2237  Urine rapid drug screen (hosp performed)  ONCE - STAT,   STAT     11/30/18 2237   11/30/18 2234  Comprehensive metabolic panel  Once,   STAT     11/30/18 2237   11/30/18 2234  Magnesium  Once,   STAT     11/30/18 2237   11/30/18  2234  Phosphorus  Once,   STAT     11/30/18 2237   11/30/18 2234  CBC  Once,   STAT     11/30/18 2237          Vitals/Pain Today's Vitals   12/01/18 0130 12/01/18 0200 12/01/18 0230 12/01/18 0300  BP: (!) 121/92 110/75 105/71 118/83  Pulse: 86 85 86 81  Resp: 19 (!) 23 17 20   Temp:      SpO2: 98% 99% 99% 98%  PainSc:        Isolation Precautions No active isolations  Medications Medications  sodium chloride (PF) 0.9 % injection (has no administration in time range)  LORazepam (ATIVAN) tablet 1-4 mg (has no administration in time range)    Or  LORazepam (ATIVAN) injection 1-4 mg (has no administration in time range)  thiamine (VITAMIN B-1) tablet 100 mg (has no administration in time range)    Or  thiamine (B-1) injection 100 mg (has no administration in time range)  folic acid (FOLVITE) tablet 1 mg (has no administration in time range)  multivitamin with minerals tablet 1 tablet (has no administration in time range)  ondansetron (ZOFRAN) tablet 4 mg (has no administration in time range)    Or  ondansetron (ZOFRAN) injection 4 mg (has no administration in time range)  LORazepam (ATIVAN) injection 0-4 mg (has no administration in time range)    Followed by  LORazepam (ATIVAN) injection 0-4 mg (has no administration in time range)  lactated ringers infusion (has no administration in time range)  morphine 2 MG/ML injection 1 mg (has no administration in time range)  sodium chloride flush (NS) 0.9 % injection 3 mL (3 mLs Intravenous Given 11/30/18 1808)  sodium chloride 0.9 % bolus 1,000 mL (0 mLs Intravenous Stopped 11/30/18 2034)  ondansetron (ZOFRAN) injection 4 mg (4 mg Intravenous Given 11/30/18 1740)  fentaNYL (SUBLIMAZE)  injection 50 mcg (50 mcg Intravenous Given 11/30/18 1740)  iohexol (OMNIPAQUE) 300 MG/ML solution 100 mL (100 mLs Intravenous Contrast Given 11/30/18 2017)  iohexol (OMNIPAQUE) 300 MG/ML solution 30 mL (30 mLs Oral Contrast Given 11/30/18 2016)    Mobility walks High fall risk   Focused Assessments Neuro Assessment Handoff:  Swallow screen pass? Yes          I     R Recommendations: See Admitting Provider Note  Report given to:   Additional Notes:

## 2018-12-01 NOTE — Progress Notes (Signed)
RN received a call from Lamar Blinks, NP. NP advised RN to notify Dr. Hal Hope of the information the previous note (See Below).    RN paged Dr. Hal Hope and notified him of the following. Pt arrived on the unit from the ED. Pt only responding to pain. 1mg  Ativan given at 0357 in the ED prior to arriving on the unit. Vital signs are stable. RN attempted to use the [place] interpreter, the patient did not interact with the interpreter due to drowsiness.Pt arrived on the unit from the ED. Pt only responding to pain. 1mg  Ativan given at 0357 in the ED prior to arriving on the unit. Vital signs are stable. RN attempted to use the [place]ese interpreter, the patient did not interact with the interpreter due to drowsiness.   Dr. Hal Hope ordered D50% 25mg  to be given to the patient, ABG and Lactic Acid to be drawn. CBG at 0500 was 81.  When RN went into the patient's room to give the D50% 25mg , the patient was more alert. RN notified Dr. Hal Hope. Orders received - Discontinue ABG and D50% 25mg  and recheck CBG in 1 hour.

## 2018-12-01 NOTE — Progress Notes (Signed)
Used [facility] [phone] [place] interpreter service for completing the admission navigator

## 2018-12-01 NOTE — ED Provider Notes (Signed)
[facility] Provider Note   CSN: [id number] Arrival date & time: 11/30/18  1302     History   Chief Complaint Chief Complaint  Patient presents with  . Abdominal Pain    HPI Webb Lawsai Slone is a 48 y.o. male.     Level 5 caveat for acuity of condition and language barrier.  Language line used.  Patient was discovered on the side of the road with abdominal pain and vomiting.  Past medical history of pancreatitis, although patient denies this.  Nursing notes report patient smokes cigarettes and drinks alcohol.     Past Medical History:  Diagnosis Date  . Alcohol-induced pancreatitis   . Tobacco abuse   . Vision decreased     Patient Active Problem List   Diagnosis Date Noted  . Acute pancreatitis 05/16/2018  . Pancreatic pseudocyst 05/16/2018  . Headache 05/16/2018  . Hyponatremia 05/16/2018  . Thrombocytopenia (HCC) 10/04/2017  . Acute alcoholic pancreatitis 10/04/2017  . Colitis 07/12/2014  . Acute esophagitis 07/12/2014  . Abdominal pain 07/06/2014  . Elevated liver enzymes 07/06/2014  . Transaminitis 07/06/2014  . GI bleed 07/06/2014  . Alcohol abuse 07/06/2014  . Tobacco abuse     Past Surgical History:  Procedure Laterality Date  . ESOPHAGOGASTRODUODENOSCOPY N/A 07/07/2014   Procedure: ESOPHAGOGASTRODUODENOSCOPY (EGD);  Surgeon: Jeani HawkingPatrick Hung, MD;  [facility];  Service: Endoscopy;  Laterality: N/A;        Home Medications    Prior to Admission medications   Not on File    Family History History reviewed. No pertinent family history.  Social History Social History   Tobacco Use  . Smoking status: Current Every Day Smoker    Packs/day: 0.25    Years: 35.00    Pack years: 8.75    Types: Cigarettes  . Smokeless tobacco: Never Used  Substance Use Topics  . Alcohol use: Yes    Alcohol/week: 3.0 standard drinks    Types: 3 Shots of liquor per week  . Drug use: No     Allergies   Patient  has no known allergies.   Review of Systems Review of Systems  Unable to perform ROS: Acuity of condition     Physical Exam Updated Vital Signs BP 112/85 (BP Location: Right Arm)   Pulse 90   Temp 98.3 F (36.8 C) (Oral)   Resp 20   Ht 5\' 2"  (1.575 m)   Wt 53.1 kg   SpO2 99%   BMI 21.40 kg/m   Physical Exam Vitals signs and nursing note reviewed.  Constitutional:      Comments: Dehydrated, in pain  HENT:     Head: Normocephalic and atraumatic.  Eyes:     Conjunctiva/sclera: Conjunctivae normal.  Neck:     Musculoskeletal: Neck supple.  Cardiovascular:     Rate and Rhythm: Normal rate and regular rhythm.  Pulmonary:     Effort: Pulmonary effort is normal.     Breath sounds: Normal breath sounds.  Abdominal:     General: Bowel sounds are normal.     Palpations: Abdomen is soft.     Comments: Tender epigastrium.  Musculoskeletal: Normal range of motion.  Skin:    General: Skin is warm and dry.  Neurological:     Mental Status: He is alert and oriented to person, place, and time.  Psychiatric:        Behavior: Behavior normal.      ED Treatments / Results  Labs (all labs ordered  are listed, but only abnormal results are displayed) Labs Reviewed  CBC - Abnormal; Notable for the following components:      Result Value   Platelets 83 (*)    All other components within normal limits  URINALYSIS, ROUTINE W REFLEX MICROSCOPIC - Abnormal; Notable for the following components:   Color, Urine AMBER (*)    Ketones, ur 80 (*)    Protein, ur 30 (*)    All other components within normal limits  COMPREHENSIVE METABOLIC PANEL - Abnormal; Notable for the following components:   Sodium 131 (*)    Chloride 93 (*)    Calcium 8.8 (*)    Total Protein 8.7 (*)    AST 151 (*)    Alkaline Phosphatase 134 (*)    Total Bilirubin 2.7 (*)    All other components within normal limits  LIPASE, BLOOD - Abnormal; Notable for the following components:   Lipase 556 (*)    All  other components within normal limits  COMPREHENSIVE METABOLIC PANEL - Abnormal; Notable for the following components:   Sodium 130 (*)    Chloride 96 (*)    Calcium 8.3 (*)    Albumin 3.0 (*)    AST 140 (*)    Total Bilirubin 2.5 (*)    All other components within normal limits  MAGNESIUM - Abnormal; Notable for the following components:   Magnesium 1.4 (*)    All other components within normal limits  CBC - Abnormal; Notable for the following components:   Platelets 62 (*)    All other components within normal limits  BILIRUBIN, DIRECT - Abnormal; Notable for the following components:   Bilirubin, Direct 0.6 (*)    All other components within normal limits  AMMONIA - Abnormal; Notable for the following components:   Ammonia 48 (*)    All other components within normal limits  SARS CORONAVIRUS 2 (TAT 6-24 HRS)  ETHANOL  PHOSPHORUS  RAPID URINE DRUG SCREEN, HOSP PERFORMED  HIV ANTIBODY (ROUTINE TESTING W REFLEX)  GLUCOSE, CAPILLARY  GLUCOSE, CAPILLARY  GLUCOSE, CAPILLARY  HIV4GL SAVE TUBE  LIPASE, BLOOD  BASIC METABOLIC PANEL  CBG MONITORING, ED    EKG None  Radiology Ct Abdomen Pelvis W Contrast  Result Date: 11/30/2018 CLINICAL DATA:  Epigastric pain elevated LFT EXAM: CT ABDOMEN AND PELVIS WITH CONTRAST TECHNIQUE: Multidetector CT imaging of the abdomen and pelvis was performed using the standard protocol following bolus administration of intravenous contrast. CONTRAST:  53mL OMNIPAQUE IOHEXOL 300 MG/ML SOLN, OMNIPAQUE IOHEXOL 300 MG/ML SOLN COMPARISON:  CT 05/16/2018, 10/05/2017 FINDINGS: Lower chest: Lung bases demonstrate no acute consolidation or effusion. The heart size is normal. Hepatobiliary: Hepatic steatosis. Possible subtle contour nodularity of the anterior liver. Tortuous gallbladder without calcified stone. No biliary dilatation Pancreas: Generalized peripancreatic edema, suspect for pancreatitis. 16 mm indeterminate hypodensity in the region of  uncinate process, prior pseudocyst noted in the region on May 16, 2018. Hypodense lesion at the neck of the pancreas measuring 2.1 cm, previously 2 cm. 14 mm peripancreatic cystic lesion adjacent to uncinate process. Spleen: Normal in size without focal abnormality. Adrenals/Urinary Tract: Adrenal glands are within normal limits. No hydronephrosis. Slightly thick-walled urinary bladder Stomach/Bowel: Stomach is nonenlarged. Fluid and edema surrounding the second and third portion of duodenum. No colon wall thickening. Vascular/Lymphatic: Nonaneurysmal aorta. Aortic atherosclerosis. No significant adenopathy Reproductive: Prostate is unremarkable. Other: Negative for free air Musculoskeletal: No acute or significant osseous findings. IMPRESSION: 1. Generalized peripancreatic soft tissue stranding and edema consistent  with acute pancreatitis. Decreased size of previously noted pseudocyst at the uncinate process/posterior pancreatic head with residual 16 mm intermediate density in the region. New 14 mm peripancreatic cystic lesion adjacent to uncinate process, possible small pseudocysts. Persistent but unchanged size of indeterminate hypodense lesion at the neck of the pancreas, for which MRI evaluation is suggested after resolution of acute symptoms. 2. Edema and inflammatory change about the duodenum, either due to duodenitis or reactive inflammatory change from pancreatitis 3. Hepatic steatosis. Suspicion of subtle contour nodularity of the anterior liver as may be seen with cirrhosis. Electronically Signed   By: Donavan Foil M.D.   On: 11/30/2018 20:51    Procedures Procedures (including critical care time)  Medications Ordered in ED Medications  sodium chloride (PF) 0.9 % injection (has no administration in time range)  folic acid (FOLVITE) tablet 1 mg (1 mg Oral Given 12/01/18 0906)  multivitamin with minerals tablet 1 tablet (1 tablet Oral Given 12/01/18 0906)  ondansetron (ZOFRAN) tablet 4 mg (has  no administration in time range)    Or  ondansetron (ZOFRAN) injection 4 mg (has no administration in time range)  lactated ringers infusion ( Intravenous New Bag/Given 12/01/18 1029)  morphine 2 MG/ML injection 1 mg (has no administration in time range)  thiamine (B-1) injection 100 mg (100 mg Intravenous Given 12/01/18 0906)  pantoprazole (PROTONIX) injection 40 mg (40 mg Intravenous Given 12/01/18 0906)  sodium chloride flush (NS) 0.9 % injection 3 mL (3 mLs Intravenous Given 11/30/18 1808)  sodium chloride 0.9 % bolus 1,000 mL (0 mLs Intravenous Stopped 11/30/18 2034)  ondansetron (ZOFRAN) injection 4 mg (4 mg Intravenous Given 11/30/18 1740)  fentaNYL (SUBLIMAZE) injection 50 mcg (50 mcg Intravenous Given 11/30/18 1740)  iohexol (OMNIPAQUE) 300 MG/ML solution 100 mL (100 mLs Intravenous Contrast Given 11/30/18 2017)  iohexol (OMNIPAQUE) 300 MG/ML solution 30 mL (30 mLs Oral Contrast Given 11/30/18 2016)  potassium chloride 10 mEq in 100 mL IVPB (10 mEq Intravenous New Bag/Given 12/01/18 1324)  magnesium sulfate IVPB 2 g 50 mL (2 g Intravenous New Bag/Given 12/01/18 1104)     Initial Impression / Assessment and Plan / ED Course  I have reviewed the triage vital signs and the nursing notes.  Pertinent labs & imaging results that were available during my care of the patient were reviewed by me and considered in my medical decision making (see chart for details).        Patient presents with abdominal pain with associated nausea and vomiting.  He is dehydrated.  IV fluids, pain management.  CT abdomen pelvis reveal acute pancreatitis.  Admit.  CRITICAL CARE Performed by: Nat Christen Total critical care time: 30 minutes Critical care time was exclusive of separately billable procedures and treating other patients. Critical care was necessary to treat or prevent imminent or life-threatening deterioration. Critical care was time spent personally by me on the following activities:  development of treatment plan with patient and/or surrogate as well as nursing, discussions with consultants, evaluation of patient's response to treatment, examination of patient, obtaining history from patient or surrogate, ordering and performing treatments and interventions, ordering and review of laboratory studies, ordering and review of radiographic studies, pulse oximetry and re-evaluation of patient's condition.  Final Clinical Impressions(s) / ED Diagnoses   Final diagnoses:  Acute pancreatitis, unspecified complication status, unspecified pancreatitis type    ED Discharge Orders    None       Nat Christen, MD 12/01/18 1733

## 2018-12-01 NOTE — Progress Notes (Signed)
Pt arrived on the unit from the ED. Pt only responding to pain. 1mg  Ativan given at 0357 in the ED prior to arriving on the unit. Vital signs are stable. RN attempted to use the [place] interpreter, the patient did not interact with the interpreter due to drowsiness.   RN notified Lamar Blinks, NP. RN will continue to monitor the patient.

## 2018-12-01 NOTE — Plan of Care (Addendum)
  Problem: Clinical Measurements: Goal: Respiratory complications will improve Outcome: Progressing Goal: Cardiovascular complication will be avoided Outcome: Progressing   Problem: Coping: Goal: Level of anxiety will decrease Outcome: Progressing   

## 2018-12-02 LAB — GLUCOSE, CAPILLARY
Glucose-Capillary: 74 mg/dL (ref 70–99)
Glucose-Capillary: 83 mg/dL (ref 70–99)
Glucose-Capillary: 151 mg/dL — ABNORMAL HIGH (ref 70–99)

## 2018-12-02 LAB — BASIC METABOLIC PANEL
Anion gap: 10 (ref 5–15)
BUN: 10 mg/dL (ref 6–20)
CO2: 21 mmol/L — ABNORMAL LOW (ref 22–32)
Calcium: 8.6 mg/dL — ABNORMAL LOW (ref 8.9–10.3)
Chloride: 98 mmol/L (ref 98–111)
Creatinine, Ser: 0.57 mg/dL — ABNORMAL LOW (ref 0.61–1.24)
GFR calc Af Amer: >60 mL/min (ref >60)
GFR calc non Af Amer: >60 mL/min (ref >60)
Glucose, Bld: 78 mg/dL (ref 70–99)
Potassium: 3.6 mmol/L (ref 3.5–5.1)
Sodium: 129 mmol/L — ABNORMAL LOW (ref 135–145)

## 2018-12-02 LAB — LIPASE, BLOOD: Lipase: 138 U/L — ABNORMAL HIGH (ref 11–51)

## 2018-12-02 MED ORDER — LACTATED RINGERS IV SOLN
INTRAVENOUS | Status: AC
  Administered 2018-12-02 (×2): via INTRAVENOUS

## 2018-12-02 NOTE — Progress Notes (Signed)
PROGRESS NOTE  Cole Ward  DOB: May 25, 1970  PCP: Julieanne Manson, MD UKG:[id number]  DOA: 11/30/2018  LOS: 2 days   Brief narrative: Patient is a 48 y.o. male with with a history of recurrent alcohol induced pancreatitis with pseudocyst who presented to the ED on 10/14 because of severe abdominal pain involving the epigastric area with no radiation and sent no associated chest pain shortness of breath nausea vomiting or diarrhea.  Pain is mostly epigastric severe in nature stabbing.   In the ER patient was afebrile COVID-19 test was negative.  CT abdomen pelvis shows features concerning for pancreatitis with a hypodensity seen which will need MRI once patient's pancreatitis improved. The previous pseudocyst is improved but there is another pseudocyst developing.  No definite signs of any infection.  Labs show sodium 131 creatinine 0.6 WBC 6.5 hemoglobin 15 platelets 83 lipase was 556 AST 151 ALT 29 total bilirubin 2.7 Albumin 3.5 urine drug screen is negative.    Subjective: Patient was seen and examined this morning.  Middle-aged male of [place] origin.  Lying in bed. Used interpreter through [employer].  Abdominal pain has significantly improved.  No tenderness on my deep palpation.  Nausea vomiting improved as well.  I started the patient on clear liquid diet today.  Lipase level improved to 138.    Assessment/Plan: Acute pancreatitis likely alcohol induced  -CT scan shows a hypodensity and also a new peripancreatic cystic lesion for which MRI needs to be done after resolution of the acute symptoms.  -Symptoms improving with conservative management. Lipase level improving as well, 556 yesterday, 138 today.  Slowdown on IV fluid to 75 mL/h.  Started clear liquid diet this morning. Tolerated.  Advanced to full for the evening.  I also put the order for soft diet to start tomorrow morning.  If patient tolerates it tomorrow, patient may be ready for discharge tomorrow.    Pancreatic lesion   -Per CT scan of abdomen, patient has a persistent but unchanged size of indeterminate hypodense lesion at the neck of the pancreas, for which he will need an MRI done after acute symptoms of pancreatitis resolve.   History of alcohol abuse - patient states he has not had any alcohol for last 1 year. Did not have any withdrawal symptoms in the hospital.  CT abdomen shows possible subtle contour nodularity of the anterior liver, possible early cirrhosis.  Continue  thiamine.  Thrombocytopenia -chronic.  Platelets 62 today.  Mobility: Encourage ambulation DVT prophylaxis:  SCDs because of thrombocytopenia Code Status:   Code Status: Full Code  Family Communication:  Not available Expected Discharge:  Anticipate discharge home tomorrow.  Consultants:  none  Procedures:  none  Antimicrobials: Anti-infectives (From admission, onward)   None      Diet Order            DIET SOFT Room service appropriate? Yes; Fluid consistency: Thin  Diet effective 0500        Diet full liquid Room service appropriate? Yes; Fluid consistency: Thin  Diet effective now              Infusions:  . lactated ringers 150 mL/hr at 12/02/18 1024    Scheduled Meds: . folic acid  1 mg Oral Daily  . multivitamin with minerals  1 tablet Oral Daily  . pantoprazole (PROTONIX) IV  40 mg Intravenous Daily  . thiamine injection  100 mg Intravenous Daily    PRN meds: morphine injection, ondansetron **OR** ondansetron (ZOFRAN) IV  Objective: Vitals:   12/02/18 0627 12/02/18 1417  BP: 114/87 (!) 134/97  Pulse: 76 74  Resp: 18 18  Temp: 98.4 F (36.9 C) 98.3 F (36.8 C)  SpO2: 100% 100%    Intake/Output Summary (Last 24 hours) at 12/02/2018 1456 Last data filed at 12/02/2018 0300 Gross per 24 hour  Intake 2711.3 ml  Output -  Net 2711.3 ml   Filed Weights   12/01/18 0500 12/02/18 0500  Weight: 53.1 kg 53.2 kg   Weight change: 0.129 kg Body mass index is 21.45 kg/m.   Physical Exam:  General exam: Thin built male. Skin: No rashes, lesions or ulcers. HEENT: Atraumatic, normocephalic, supple neck, no obvious bleeding Lungs: Clear to auscultation bilaterally CVS: Regular rate and rhythm, normal GI/Abd soft, nontender, nondistended, bowel sound present  CNS: Alert, awake, oriented x3  Psychiatry: -Mood appropriate. Extremities: No pedal edema, no calf tenderness  Data Review: I have personally reviewed the laboratory data and studies available.  Recent Labs  Lab 11/30/18 1346 12/01/18 0452  WBC 6.5 4.7  HGB 15.0 14.1  HCT 46.2 42.9  MCV 95.1 95.5  PLT 83* 62*   Recent Labs  Lab 11/30/18 1526 12/01/18 0452 12/02/18 0456  NA 131* 130* 129*  K 4.3 3.5 3.6  CL 93* 96* 98  CO2 26 23 21*  GLUCOSE 85 90 78  BUN 11 10 10   CREATININE 0.69 0.64 0.57*  CALCIUM 8.8* 8.3* 8.6*  MG  --  1.4*  --   PHOS  --  3.0  --    Recent Labs  Lab 11/30/18 1526 12/02/18 0456  LIPASE 556* 138*    Terrilee Croak, MD  [facility] 12/02/2018

## 2018-12-02 NOTE — Plan of Care (Signed)

## 2018-12-03 ENCOUNTER — Inpatient Hospital Stay (HOSPITAL_COMMUNITY): Payer: Self-pay

## 2018-12-03 DIAGNOSIS — K852 Alcohol induced acute pancreatitis without necrosis or infection: Principal | ICD-10-CM

## 2018-12-03 LAB — GLUCOSE, CAPILLARY
Glucose-Capillary: 115 mg/dL — ABNORMAL HIGH (ref 70–99)
Glucose-Capillary: 127 mg/dL — ABNORMAL HIGH (ref 70–99)
Glucose-Capillary: 98 mg/dL (ref 70–99)

## 2018-12-03 LAB — LIPASE, BLOOD: Lipase: 248 U/L — ABNORMAL HIGH (ref 11–51)

## 2018-12-03 NOTE — Plan of Care (Signed)
Continue current POC 

## 2018-12-03 NOTE — Progress Notes (Signed)
[place] interpreter called to explain to patient again that MRI needs to be done this afternoon before he can be discharged home by his doctor. Pt. Stating he is ready to leave and feels very hungry. He verbalized understanding that MRI needs to be done and to remain NPO.

## 2018-12-03 NOTE — Progress Notes (Signed)
PROGRESS NOTE  Cole Ward  DOB: 10-29-1970  PCP: Julieanne Manson, MD LGX:[id number]  DOA: 11/30/2018  LOS: 3 days   Brief narrative: Patient is a 48 y.o. male with with a history of recurrent alcohol induced pancreatitis with pseudocyst who presented to the ED on 10/14 because of severe abdominal pain involving the epigastric area with no radiation and sent no associated chest pain shortness of breath nausea vomiting or diarrhea.  Pain is mostly epigastric severe in nature stabbing.   In the ER patient was afebrile COVID-19 test was negative. CT abdomen pelvis shows features concerning for pancreatitis with a hypodensity seen which will need MRI once patient's pancreatitis improved. The previous pseudocyst is improved but there is another pseudocyst developing.  No definite signs of any infection. Labs show sodium 131 creatinine 0.6 WBC 6.5 hemoglobin 15 platelets 83 lipase was 556 AST 151 ALT 29 total bilirubin 2.7 Albumin 3.5 urine drug screen is negative.    Subjective: Patient seen and examined in the morning.  No overnight events.  He ate all his breakfast, he even kept a piece of bread for 11 AM as he gets frequently hungry. Denies any abdominal pain.  Denies any nausea vomiting.  Bowel movements normal.  Afebrile. Patient states that he drinks 1 can of beer every night. Patient believes that his abdominal pain started after he consumed large amount of chili.  Assessment/Plan: Acute on chronic pancreatitis likely alcohol induced  and complicated with pancreatic pseudocyst: Clinically improving with conservative management.  Currently bowel functions improving. Because of recurrent pancreatitis unknown, will need to evaluate for biliary duct and pancreatic duct.Patient with language barrier, no primary provider.  Will do MRCP today without contrast.  Pancreatic lesion  Per CT scan of abdomen, patient has a persistent but unchanged size of indeterminate hypodense lesion at the neck of  the pancreas, for which he will need an MRI done after acute symptoms of pancreatitis resolve.  MRCP in the hospital before discharge.  Alcohol use: Patient states that he drinks some beer every day.  No evidence of withdrawal.  On multivitamins.  Patient does not believe drinking is a problem.  We discussed in detail that his alcohol may be causing pancreatitis.  He is agreeable to quit.    Thrombocytopenia -chronic.  No evidence of bleeding.  Mobility: Encourage ambulation DVT prophylaxis:  SCDs because of thrombocytopenia Code Status:   Code Status: Full Code  Family Communication:  Not available, patient does not have family. Called number on his contact list and they requested to be removed from the computer. Called another person in the contact list and they are not available to talk. Communicated through video interpreter. Expected Discharge:  Pending MRI findings.  Consultants:  none  Procedures:  none  Antimicrobials: Anti-infectives (From admission, onward)   None      Diet Order            Diet NPO time specified  Diet effective now            N.p.o. for MRCP.  Infusions:    Scheduled Meds: . folic acid  1 mg Oral Daily  . multivitamin with minerals  1 tablet Oral Daily  . pantoprazole (PROTONIX) IV  40 mg Intravenous Daily  . thiamine injection  100 mg Intravenous Daily    PRN meds: morphine injection, ondansetron **OR** ondansetron (ZOFRAN) IV   Objective: Vitals:   12/02/18 1417 12/02/18 2132  BP: (!) 134/97 (!) 139/93  Pulse: 74 77  Resp:  18 16  Temp: 98.3 F (36.8 C) 97.9 F (36.6 C)  SpO2: 100% 97%    Intake/Output Summary (Last 24 hours) at 12/03/2018 1054 Last data filed at 12/03/2018 0930 Gross per 24 hour  Intake 2040 ml  Output 600 ml  Net 1440 ml   Filed Weights   12/01/18 0500 12/02/18 0500 12/03/18 0500  Weight: 53.1 kg 53.2 kg 53.8 kg   Weight change: 0.6 kg Body mass index is 21.69 kg/m.   Physical Exam: General  exam: Thin built male.  Not in any distress. Skin: No rashes, lesions or ulcers. HEENT: Atraumatic, normocephalic, supple neck, no obvious bleeding Lungs: Clear to auscultation bilaterally CVS: Regular rate and rhythm, normal GI/Abd soft, nontender, nondistended, bowel sound present  CNS: Alert, awake, oriented x3  Psychiatry: -Mood appropriate. Extremities: No pedal edema, no calf tenderness  Data Review: I have personally reviewed the laboratory data and studies available.  Recent Labs  Lab 11/30/18 1346 12/01/18 0452  WBC 6.5 4.7  HGB 15.0 14.1  HCT 46.2 42.9  MCV 95.1 95.5  PLT 83* 62*   Recent Labs  Lab 11/30/18 1526 12/01/18 0452 12/02/18 0456  NA 131* 130* 129*  K 4.3 3.5 3.6  CL 93* 96* 98  CO2 26 23 21*  GLUCOSE 85 90 78  BUN 11 10 10   CREATININE 0.69 0.64 0.57*  CALCIUM 8.8* 8.3* 8.6*  MG  --  1.4*  --   PHOS  --  3.0  --    Recent Labs  Lab 11/30/18 1526 12/02/18 0456 12/03/18 [place]    Barb Merino, MD  [facility] 12/03/2018

## 2018-12-03 NOTE — Progress Notes (Signed)
[place] interpreter contacted to help explain to patient that he will be having an MRI of his abdomen today around 3-4pm and will need to be NPO after 11am. Pt. Verbalized understanding.

## 2018-12-03 NOTE — Progress Notes (Signed)
Patient dressed and trying to get on elevator. He states "go now". Asked pt to return to room. He became tearful. Explained that his Dr. Lynnda Child be called and informed of patients desire to leave now. MD notified and he stated "he can leave but needs to sign AMA form". Pt. Informed of AMA form and was agreeable to signing. Pt. Discharged AMA. He states he called a friend to pick him up.

## 2018-12-05 NOTE — Discharge Summary (Signed)
Patient was admitted to the hospital with recurrent alcohol induced pancreatitis with pseudocyst.  Presented with severe abdominal pain, improved with symptomatic treatment.  He was adequately improved and eating regular diet and had normal bowel function.  Patient had abnormal looking pancreas, he needed more investigation with MRCP and GI referral due to abnormal CAT scan.  We had been arranging for his investigations and he was actively being treated in the hospital.  By evening, he decided to go home, stated that everything is fine.  Patient was medically stable at the time, however his testings and imaging studies were pending.  He left AGAINST MEDICAL ADVICE knowing all the risks of not waiting for investigations and risk for recurrence of symptoms.  He was advised with [place] interpreter to come back to ER for any recurrent pain or difficulties.

## 2018-12-24 ENCOUNTER — Other Ambulatory Visit: Payer: Self-pay

## 2018-12-24 ENCOUNTER — Emergency Department (HOSPITAL_COMMUNITY)
Admission: EM | Admit: 2018-12-24 | Discharge: 2018-12-24 | Disposition: A | Discharge status: Home / Self Care | Payer: Medicaid Other | Attending: Emergency Medicine | Admitting: Emergency Medicine

## 2018-12-24 DIAGNOSIS — Z20828 Contact with and (suspected) exposure to other viral communicable diseases: Secondary | ICD-10-CM | POA: Insufficient documentation

## 2018-12-24 DIAGNOSIS — F17210 Nicotine dependence, cigarettes, uncomplicated: Secondary | ICD-10-CM | POA: Insufficient documentation

## 2018-12-24 DIAGNOSIS — R509 Fever, unspecified: Secondary | ICD-10-CM | POA: Insufficient documentation

## 2018-12-24 MED ORDER — SODIUM CHLORIDE 0.9% FLUSH: 3.0000 mL | Freq: Once | INTRAVENOUS | Status: DC

## 2018-12-24 NOTE — ED Triage Notes (Addendum)
Pt arrives with [employer] for Covid test. Pt was under arrest and taken to jail where he was noted to have a fever. Pt does not speak [place]. Interpreter used. Pt speaks [place].  Pt denies any fevers or any pain anywhere. Endorses drinking alcohol. Refusing blood work. States he is healthy and feels fine, denies exposure to covid. Pt arrives in fleece jacket and down jacket on top.

## 2018-12-24 NOTE — Discharge Instructions (Addendum)
You were tested for coronavirus.  The results should return in 6 to 24 hours. If you develop difficulty breathing, come to the emergency room for reevaluation. Use Tylenol or ibuprofen as needed for fever or body aches. Make sure stay well-hydrated water. Return with any new, worsening, or concerning symptoms.

## 2018-12-25 LAB — SARS CORONAVIRUS 2 (TAT 6-24 HRS): SARS Coronavirus 2: NEGATIVE

## 2018-12-25 NOTE — ED Provider Notes (Signed)
[facility] EMERGENCY DEPARTMENT Provider Note   CSN: [id number] Arrival date & time: 12/24/18  1916     History   Chief Complaint Chief Complaint  Patient presents with  . Fever    HPI Esteban Kobashigawa is a 48 y.o. male presenting for Covid test.  Patient speaks Burmese, history obtained with assistance of interpreter line.  Patient states he feels fine, has no complaints, and does not need any testing.  Patient denies known fever, cough, chest pain. He deneis nausea, vomiting, abd pain, or change in appetite  Patient states he was drinking alcohol today, it is why he had a fever when he was taken to jail.  He denies contact with known COVID-19 positive person.  He does consent for Covid testing, but refuses blood work.   Additional history obtained from [employer].  Patient was arrested and brought to jail, temperature was found to be 102.  As such she was brought to the ED for Covid testing.     HPI  Past Medical History:  Diagnosis Date  . Alcohol-induced pancreatitis   . Tobacco abuse   . Vision decreased     Patient Active Problem List   Diagnosis Date Noted  . Acute pancreatitis 05/16/2018  . Pancreatic pseudocyst 05/16/2018  . Headache 05/16/2018  . Hyponatremia 05/16/2018  . Thrombocytopenia (HCC) 10/04/2017  . Acute alcoholic pancreatitis 10/04/2017  . Colitis 07/12/2014  . Acute esophagitis 07/12/2014  . Abdominal pain 07/06/2014  . Elevated liver enzymes 07/06/2014  . Transaminitis 07/06/2014  . GI bleed 07/06/2014  . Alcohol abuse 07/06/2014  . Tobacco abuse     Past Surgical History:  Procedure Laterality Date  . ESOPHAGOGASTRODUODENOSCOPY N/A 07/07/2014   Procedure: ESOPHAGOGASTRODUODENOSCOPY (EGD);  Surgeon: Jeani Hawking, MD;  Location: [facility] ENDOSCOPY;  Service: Endoscopy;  Laterality: N/A;        Home Medications    Prior to Admission medications   Not on File    Family History No family history on file.  Social History Social  History   Tobacco Use  . Smoking status: Current Every Day Smoker    Packs/day: 0.25    Years: 35.00    Pack years: 8.75    Types: Cigarettes  . Smokeless tobacco: Never Used  Substance Use Topics  . Alcohol use: Yes    Alcohol/week: 3.0 standard drinks    Types: 3 Shots of liquor per week  . Drug use: No     Allergies   Patient has no known allergies.   Review of Systems Review of Systems  Constitutional: Positive for fever.  All other systems reviewed and are negative.    Physical Exam Updated Vital Signs BP (!) 122/92   Pulse 92   Temp 97.9 F (36.6 C) (Oral)   Resp (!) 23   SpO2 96%   Physical Exam Vitals signs and nursing note reviewed.  Constitutional:      General: He is not in acute distress.    Appearance: He is well-developed.     Comments: Resting comfortably in the bed in no acute distress  HENT:     Head: Normocephalic and atraumatic.  Neck:     Musculoskeletal: Normal range of motion.  Cardiovascular:     Rate and Rhythm: Normal rate and regular rhythm.     Pulses: Normal pulses.     Comments: Heart rate 96 on my exam Pulmonary:     Effort: Pulmonary effort is normal.     Breath sounds: Normal breath  sounds.     Comments: Clear lung sounds in all fields Abdominal:     General: There is no distension.     Palpations: There is no mass.     Tenderness: There is no abdominal tenderness. There is no guarding or rebound.  Musculoskeletal: Normal range of motion.  Skin:    General: Skin is warm.     Findings: No rash.  Neurological:     Mental Status: He is alert and oriented to person, place, and time.      ED Treatments / Results  Labs (all labs ordered are listed, but only abnormal results are displayed) Labs Reviewed  SARS CORONAVIRUS 2 (TAT 6-24 HRS)    EKG None  Radiology No results found.  Procedures Procedures (including critical care time)  Medications Ordered in ED Medications - No data to display   Initial  Impression / Assessment and Plan / ED Course  I have reviewed the triage vital signs and the nursing notes.  Pertinent labs & imaging results that were available during my care of the patient were reviewed by me and considered in my medical decision making (see chart for details).        Patient presenting for Covid test after being found to have a fever.  Patient denies any symptoms or complaints at this time.  He does agree to have a Covid test, but refuses blood work.  Initially patient was tachycardic, although this resolved without intervention.  Patient is afebrile in the ED, has not been given anything for his fever.  As he has no complaints and vitals are stable and he is alert and oriented, no blood work obtained.  Covid test ordered, and [employer] informed that results should return in 6-24 hrs, pt should be isolated until results return.  At this time, patient appears safe for discharge.  Return precautions given.  Patient states he understands and agrees to plan.   Final Clinical Impressions(s) / ED Diagnoses   Final diagnoses:  Fever, unspecified fever cause    ED Discharge Orders    None       Franchot Heidelberg, PA-C 12/25/18 0127    Sherwood Gambler, MD 12/25/18 1520

## 2019-01-31 ENCOUNTER — Emergency Department (HOSPITAL_COMMUNITY): Payer: Self-pay

## 2019-01-31 ENCOUNTER — Encounter (HOSPITAL_COMMUNITY)
Admission: EM | Disposition: A | Discharge status: Home / Self Care | Payer: Self-pay | Source: Home / Self Care | Attending: Emergency Medicine

## 2019-01-31 ENCOUNTER — Emergency Department (HOSPITAL_COMMUNITY): Payer: Self-pay | Admitting: Anesthesiology

## 2019-01-31 ENCOUNTER — Ambulatory Visit (HOSPITAL_COMMUNITY)
Admission: EM | Admit: 2019-01-31 | Discharge: 2019-02-01 | Disposition: A | Discharge status: Home / Self Care | Payer: Self-pay | Attending: Orthopedic Surgery | Admitting: Orthopedic Surgery

## 2019-01-31 DIAGNOSIS — S62627B Displaced fracture of medial phalanx of left little finger, initial encounter for open fracture: Secondary | ICD-10-CM | POA: Insufficient documentation

## 2019-01-31 DIAGNOSIS — S66327A Laceration of extensor muscle, fascia and tendon of left little finger at wrist and hand level, initial encounter: Secondary | ICD-10-CM | POA: Insufficient documentation

## 2019-01-31 DIAGNOSIS — Z20828 Contact with and (suspected) exposure to other viral communicable diseases: Secondary | ICD-10-CM | POA: Insufficient documentation

## 2019-01-31 DIAGNOSIS — S61319A Laceration without foreign body of unspecified finger with damage to nail, initial encounter: Secondary | ICD-10-CM

## 2019-01-31 DIAGNOSIS — S63417A Traumatic rupture of collateral ligament of left little finger at metacarpophalangeal and interphalangeal joint, initial encounter: Secondary | ICD-10-CM | POA: Insufficient documentation

## 2019-01-31 DIAGNOSIS — S0181XA Laceration without foreign body of other part of head, initial encounter: Secondary | ICD-10-CM | POA: Insufficient documentation

## 2019-01-31 DIAGNOSIS — Y92009 Unspecified place in unspecified non-institutional (private) residence as the place of occurrence of the external cause: Secondary | ICD-10-CM | POA: Insufficient documentation

## 2019-01-31 DIAGNOSIS — S61209A Unspecified open wound of unspecified finger without damage to nail, initial encounter: Secondary | ICD-10-CM

## 2019-01-31 DIAGNOSIS — S56429A Laceration of extensor muscle, fascia and tendon of unspecified finger at forearm level, initial encounter: Secondary | ICD-10-CM

## 2019-01-31 HISTORY — PX: I & D EXTREMITY: SHX5045

## 2019-01-31 LAB — COMPREHENSIVE METABOLIC PANEL
ALT: 35 U/L (ref 0–44)
AST: 237 U/L — ABNORMAL HIGH (ref 15–41)
Albumin: 3.3 g/dL — ABNORMAL LOW (ref 3.5–5.0)
Alkaline Phosphatase: 131 U/L — ABNORMAL HIGH (ref 38–126)
Anion gap: 15 (ref 5–15)
BUN: 6 mg/dL (ref 6–20)
CO2: 23 mmol/L (ref 22–32)
Calcium: 8.5 mg/dL — ABNORMAL LOW (ref 8.9–10.3)
Chloride: 101 mmol/L (ref 98–111)
Creatinine, Ser: 0.73 mg/dL (ref 0.61–1.24)
GFR calc Af Amer: >60 mL/min (ref >60)
GFR calc non Af Amer: >60 mL/min (ref >60)
Glucose, Bld: 127 mg/dL — ABNORMAL HIGH (ref 70–99)
Potassium: 4.1 mmol/L (ref 3.5–5.1)
Sodium: 139 mmol/L (ref 135–145)
Total Bilirubin: 0.9 mg/dL (ref 0.3–1.2)
Total Protein: 8 g/dL (ref 6.5–8.1)

## 2019-01-31 LAB — I-STAT CHEM 8, ED
BUN: 6 mg/dL (ref 6–20)
Calcium, Ion: 0.94 mmol/L — ABNORMAL LOW (ref 1.15–1.40)
Chloride: 102 mmol/L (ref 98–111)
Creatinine, Ser: 1 mg/dL (ref 0.61–1.24)
Glucose, Bld: 126 mg/dL — ABNORMAL HIGH (ref 70–99)
HCT: 46 % (ref 39.0–52.0)
Hemoglobin: 15.6 g/dL (ref 13.0–17.0)
Potassium: 4.1 mmol/L (ref 3.5–5.1)
Sodium: 140 mmol/L (ref 135–145)
TCO2: 28 mmol/L (ref 22–32)

## 2019-01-31 LAB — CBC WITH DIFFERENTIAL/PLATELET
Abs Immature Granulocytes: 0.03 10*3/uL (ref 0.00–0.07)
Basophils Absolute: 0.1 10*3/uL (ref 0.0–0.1)
Basophils Relative: 2 %
Eosinophils Absolute: 0.3 10*3/uL (ref 0.0–0.5)
Eosinophils Relative: 4 %
HCT: 43.7 % (ref 39.0–52.0)
Hemoglobin: 14.3 g/dL (ref 13.0–17.0)
Immature Granulocytes: 1 %
Lymphocytes Relative: 37 %
Lymphs Abs: 2.2 10*3/uL (ref 0.7–4.0)
MCH: 31.2 pg (ref 26.0–34.0)
MCHC: 32.7 g/dL (ref 30.0–36.0)
MCV: 95.2 fL (ref 80.0–100.0)
Monocytes Absolute: 0.7 10*3/uL (ref 0.1–1.0)
Monocytes Relative: 11 %
Neutro Abs: 2.7 10*3/uL (ref 1.7–7.7)
Neutrophils Relative %: 45 %
Platelets: 178 10*3/uL (ref 150–400)
RBC: 4.59 MIL/uL (ref 4.22–5.81)
RDW: 15.9 % — ABNORMAL HIGH (ref 11.5–15.5)
WBC: 5.9 10*3/uL (ref 4.0–10.5)
nRBC: 0 % (ref 0.0–0.2)

## 2019-01-31 LAB — RESPIRATORY PANEL BY RT PCR (FLU A&B, COVID)
Influenza A by PCR: NEGATIVE
Influenza B by PCR: NEGATIVE
SARS Coronavirus 2 by RT PCR: NEGATIVE

## 2019-01-31 SURGERY — IRRIGATION AND DEBRIDEMENT EXTREMITY
Anesthesia: General | Site: Hand | Laterality: Left

## 2019-01-31 MED ORDER — LIDOCAINE HCL 2 % IJ SOLN
5.0000 mL | Freq: Once | INTRAMUSCULAR | Status: DC
  Filled 2019-01-31: qty 20

## 2019-01-31 MED ORDER — BUPIVACAINE HCL (PF) 0.25 % IJ SOLN
INTRAMUSCULAR | Status: DC | PRN
  Administered 2019-01-31: 10 mL

## 2019-01-31 MED ORDER — HYDROCODONE-ACETAMINOPHEN 5-325 MG PO TABS: ORAL_TABLET | ORAL | 0 refills | Status: AC

## 2019-01-31 MED ORDER — DEXAMETHASONE SODIUM PHOSPHATE 10 MG/ML IJ SOLN
INTRAMUSCULAR | Status: DC | PRN
  Administered 2019-01-31: 10 mg via INTRAVENOUS

## 2019-01-31 MED ORDER — PROPOFOL 10 MG/ML IV BOLUS
INTRAVENOUS | Status: DC | PRN
  Administered 2019-01-31: 120 mg via INTRAVENOUS

## 2019-01-31 MED ORDER — SUCCINYLCHOLINE CHLORIDE 200 MG/10ML IV SOSY
PREFILLED_SYRINGE | INTRAVENOUS | Status: AC
  Filled 2019-01-31: qty 10

## 2019-01-31 MED ORDER — LIDOCAINE 2% (20 MG/ML) 5 ML SYRINGE
INTRAMUSCULAR | Status: DC | PRN
  Administered 2019-01-31: 100 mg via INTRAVENOUS

## 2019-01-31 MED ORDER — ACETAMINOPHEN 500 MG PO TABS
1000.0000 mg | ORAL_TABLET | Freq: Once | ORAL | Status: AC
  Administered 2019-01-31: 1000 mg via ORAL
  Filled 2019-01-31: qty 2

## 2019-01-31 MED ORDER — ONDANSETRON HCL 4 MG/2ML IJ SOLN
INTRAMUSCULAR | Status: AC
  Filled 2019-01-31: qty 2

## 2019-01-31 MED ORDER — BUPIVACAINE HCL (PF) 0.25 % IJ SOLN
INTRAMUSCULAR | Status: AC
  Filled 2019-01-31: qty 30

## 2019-01-31 MED ORDER — HYDROMORPHONE HCL 1 MG/ML IJ SOLN: 0.2500 mg | INTRAMUSCULAR | Status: DC | PRN

## 2019-01-31 MED ORDER — PHENYLEPHRINE 40 MCG/ML (10ML) SYRINGE FOR IV PUSH (FOR BLOOD PRESSURE SUPPORT)
PREFILLED_SYRINGE | INTRAVENOUS | Status: DC | PRN
  Administered 2019-01-31: 120 ug via INTRAVENOUS

## 2019-01-31 MED ORDER — TETANUS-DIPHTH-ACELL PERTUSSIS 5-2.5-18.5 LF-MCG/0.5 IM SUSP
0.5000 mL | Freq: Once | INTRAMUSCULAR | Status: AC
  Administered 2019-01-31: 0.5 mL via INTRAMUSCULAR
  Filled 2019-01-31: qty 0.5

## 2019-01-31 MED ORDER — FENTANYL CITRATE (PF) 250 MCG/5ML IJ SOLN
INTRAMUSCULAR | Status: DC | PRN
  Administered 2019-01-31: 50 ug via INTRAVENOUS
  Administered 2019-01-31: 100 ug via INTRAVENOUS

## 2019-01-31 MED ORDER — ONDANSETRON HCL 4 MG/2ML IJ SOLN
INTRAMUSCULAR | Status: DC | PRN
  Administered 2019-01-31: 4 mg via INTRAVENOUS

## 2019-01-31 MED ORDER — SUCCINYLCHOLINE CHLORIDE 200 MG/10ML IV SOSY
PREFILLED_SYRINGE | INTRAVENOUS | Status: DC | PRN
  Administered 2019-01-31: 80 mg via INTRAVENOUS

## 2019-01-31 MED ORDER — CEFAZOLIN SODIUM-DEXTROSE 2-4 GM/100ML-% IV SOLN
2.0000 g | Freq: Once | INTRAVENOUS | Status: AC
  Administered 2019-01-31: 2 g via INTRAVENOUS
  Filled 2019-01-31: qty 100

## 2019-01-31 MED ORDER — DEXAMETHASONE SODIUM PHOSPHATE 10 MG/ML IJ SOLN
INTRAMUSCULAR | Status: AC
  Filled 2019-01-31: qty 1

## 2019-01-31 MED ORDER — 0.9 % SODIUM CHLORIDE (POUR BTL) OPTIME
TOPICAL | Status: DC | PRN
  Administered 2019-01-31: 22:00:00 1000 mL

## 2019-01-31 MED ORDER — SULFAMETHOXAZOLE-TRIMETHOPRIM 800-160 MG PO TABS: 1.0000 | ORAL_TABLET | Freq: Two times a day (BID) | ORAL | 0 refills | Status: AC

## 2019-01-31 MED ORDER — PROPOFOL 10 MG/ML IV BOLUS
INTRAVENOUS | Status: AC
  Filled 2019-01-31: qty 20

## 2019-01-31 MED ORDER — LACTATED RINGERS IV SOLN: INTRAVENOUS | Status: DC | PRN

## 2019-01-31 MED ORDER — FENTANYL CITRATE (PF) 250 MCG/5ML IJ SOLN
INTRAMUSCULAR | Status: AC
  Filled 2019-01-31: qty 5

## 2019-01-31 MED ORDER — LIDOCAINE 2% (20 MG/ML) 5 ML SYRINGE
INTRAMUSCULAR | Status: AC
  Filled 2019-01-31: qty 5

## 2019-01-31 SURGICAL SUPPLY — 54 items
BNDG COHESIVE 1X5 TAN STRL LF (GAUZE/BANDAGES/DRESSINGS) ×2 IMPLANT
BNDG COHESIVE 2X5 TAN STRL LF (GAUZE/BANDAGES/DRESSINGS) IMPLANT
BNDG ELASTIC 2X5.8 VLCR STR LF (GAUZE/BANDAGES/DRESSINGS) IMPLANT
BNDG ELASTIC 3X5.8 VLCR STR LF (GAUZE/BANDAGES/DRESSINGS) ×2 IMPLANT
BNDG ELASTIC 4X5.8 VLCR STR LF (GAUZE/BANDAGES/DRESSINGS) IMPLANT
BNDG ESMARK 4X9 LF (GAUZE/BANDAGES/DRESSINGS) ×2 IMPLANT
BNDG GAUZE ELAST 4 BULKY (GAUZE/BANDAGES/DRESSINGS) ×2 IMPLANT
CORD BIPOLAR FORCEPS 12FT (ELECTRODE) ×2 IMPLANT
COVER SURGICAL LIGHT HANDLE (MISCELLANEOUS) ×2 IMPLANT
COVER WAND RF STERILE (DRAPES) IMPLANT
CUFF TOURN SGL QUICK 18X4 (TOURNIQUET CUFF) ×2 IMPLANT
CUFF TOURN SGL QUICK 24 (TOURNIQUET CUFF)
CUFF TRNQT CYL 24X4X16.5-23 (TOURNIQUET CUFF) IMPLANT
DECANTER SPIKE VIAL GLASS SM (MISCELLANEOUS) IMPLANT
DRAIN PENROSE 1/4X12 LTX STRL (WOUND CARE) IMPLANT
DRSG PAD ABDOMINAL 8X10 ST (GAUZE/BANDAGES/DRESSINGS) IMPLANT
DRSG XEROFORM 1X8 (GAUZE/BANDAGES/DRESSINGS) ×2 IMPLANT
GAUZE SPONGE 4X4 12PLY STRL (GAUZE/BANDAGES/DRESSINGS) ×2 IMPLANT
GAUZE SPONGE 4X4 12PLY STRL LF (GAUZE/BANDAGES/DRESSINGS) ×2 IMPLANT
GAUZE XEROFORM 1X8 LF (GAUZE/BANDAGES/DRESSINGS) ×2 IMPLANT
GLOVE BIO SURGEON STRL SZ7.5 (GLOVE) ×2 IMPLANT
GLOVE BIOGEL PI IND STRL 8 (GLOVE) ×1 IMPLANT
GLOVE BIOGEL PI INDICATOR 8 (GLOVE) ×1
GOWN STRL REUS W/ TWL LRG LVL3 (GOWN DISPOSABLE) ×1 IMPLANT
GOWN STRL REUS W/ TWL XL LVL3 (GOWN DISPOSABLE) ×1 IMPLANT
GOWN STRL REUS W/TWL LRG LVL3 (GOWN DISPOSABLE) ×1
GOWN STRL REUS W/TWL XL LVL3 (GOWN DISPOSABLE) ×1
K-WIRE DBL TROCAR .035X4 (WIRE) ×2
KIT BASIN OR (CUSTOM PROCEDURE TRAY) ×2 IMPLANT
KIT TURNOVER KIT B (KITS) ×2 IMPLANT
KWIRE DBL TROCAR .035X4 (WIRE) ×1 IMPLANT
LOOP VESSEL MAXI BLUE (MISCELLANEOUS) IMPLANT
MANIFOLD NEPTUNE II (INSTRUMENTS) ×2 IMPLANT
NEEDLE HYPO 25X1 1.5 SAFETY (NEEDLE) ×2 IMPLANT
NS IRRIG 1000ML POUR BTL (IV SOLUTION) ×2 IMPLANT
PACK ORTHO EXTREMITY (CUSTOM PROCEDURE TRAY) ×2 IMPLANT
PAD ARMBOARD 7.5X6 YLW CONV (MISCELLANEOUS) ×4 IMPLANT
SET CYSTO W/LG BORE CLAMP LF (SET/KITS/TRAYS/PACK) IMPLANT
SOL PREP POV-IOD 4OZ 10% (MISCELLANEOUS) ×2 IMPLANT
SPLINT PLASTER EXTRA FAST 3X15 (CAST SUPPLIES) ×1
SPLINT PLASTER GYPS XFAST 3X15 (CAST SUPPLIES) ×1 IMPLANT
SPONGE LAP 4X18 RFD (DISPOSABLE) ×2 IMPLANT
SUT ETHILON 4 0 P 3 18 (SUTURE) ×2 IMPLANT
SUT ETHILON 4 0 PS 2 18 (SUTURE) IMPLANT
SUT MERSILENE 4 0 P 3 (SUTURE) ×2 IMPLANT
SUT MON AB 5-0 P3 18 (SUTURE) IMPLANT
SWAB COLLECTION DEVICE MRSA (MISCELLANEOUS) IMPLANT
SWAB CULTURE ESWAB REG 1ML (MISCELLANEOUS) IMPLANT
SYR CONTROL 10ML LL (SYRINGE) ×2 IMPLANT
TOWEL GREEN STERILE (TOWEL DISPOSABLE) ×2 IMPLANT
TUBE CONNECTING 12X1/4 (SUCTIONS) ×2 IMPLANT
TUBE FEEDING ENTERAL 5FR 16IN (TUBING) IMPLANT
UNDERPAD 30X30 (UNDERPADS AND DIAPERS) ×2 IMPLANT
YANKAUER SUCT BULB TIP NO VENT (SUCTIONS) IMPLANT

## 2019-01-31 NOTE — ED Notes (Signed)
[employer] audio interpreting services utilized [place] [place].

## 2019-01-31 NOTE — Transfer of Care (Signed)
Immediate Anesthesia Transfer of Care Note  Patient: Cole Ward  Procedure(s) Performed: LEFT SMALL FINGER IRRIGATION AND DEBRIEDMENT, REPAIR TENDON (Left Hand)  Patient Location: PACU  Anesthesia Type:General  Level of Consciousness: awake, alert  and patient cooperative  Airway & Oxygen Therapy: Patient Spontanous Breathing and Patient connected to nasal cannula oxygen  Post-op Assessment: Report given to RN, Post -op Vital signs reviewed and stable and Patient moving all extremities X 4  Post vital signs: Reviewed and stable  Last Vitals:  Vitals Value Taken Time  BP 139/103 01/31/19 2312  Temp    Pulse 111 01/31/19 2313  Resp 21 01/31/19 2313  SpO2 94 % 01/31/19 2313  Vitals shown include unvalidated device data.  Last Pain:  Vitals:   01/31/19 1558  TempSrc: Oral         Complications: No apparent anesthesia complications
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
## 2019-01-31 NOTE — ED Notes (Signed)
Patient transported to X-ray 

## 2019-01-31 NOTE — Anesthesia Preprocedure Evaluation (Addendum)
Anesthesia Evaluation  Patient identified by MRN, date of birth, ID band Patient awake    Reviewed: Allergy & Precautions, H&P , NPO status , Patient's Chart, lab work & pertinent test results  Airway Mallampati: II  TM Distance: >3 FB Neck ROM: Full    Dental no notable dental hx. (+) Poor Dentition, Dental Advisory Given   Pulmonary neg pulmonary ROS,    Pulmonary exam normal breath sounds clear to auscultation       Cardiovascular negative cardio ROS   Rhythm:Regular Rate:Normal     Neuro/Psych negative neurological ROS  negative psych ROS   GI/Hepatic negative GI ROS, Neg liver ROS,   Endo/Other  negative endocrine ROS  Renal/GU negative Renal ROS  negative genitourinary   Musculoskeletal   Abdominal   Peds  Hematology negative hematology ROS (+)   Anesthesia Other Findings   Reproductive/Obstetrics negative OB ROS                            Anesthesia Physical Anesthesia Plan  ASA: I  Anesthesia Plan: General   Post-op Pain Management:    Induction: Intravenous  PONV Risk Score and Plan: 3 and Ondansetron, Dexamethasone and Midazolam  Airway Management Planned: LMA and Oral ETT  Additional Equipment:   Intra-op Plan:   Post-operative Plan: Extubation in OR  Informed Consent: I have reviewed the patients History and Physical, chart, labs and discussed the procedure including the risks, benefits and alternatives for the proposed anesthesia with the patient or authorized representative who has indicated his/her understanding and acceptance.     Dental advisory given  Plan Discussed with: CRNA  Anesthesia Plan Comments:         Anesthesia Quick Evaluation
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
## 2019-01-31 NOTE — Anesthesia Procedure Notes (Signed)
Procedure Name: Intubation Date/Time: 01/31/2019 10:01 PM Performed by: Claris Che, CRNA Pre-anesthesia Checklist: Patient identified, Emergency Drugs available, Suction available, Patient being monitored and Timeout performed Patient Re-evaluated:Patient Re-evaluated prior to induction Oxygen Delivery Method: Circle system utilized Preoxygenation: Pre-oxygenation with 100% oxygen Induction Type: IV induction, Rapid sequence and Cricoid Pressure applied Laryngoscope Size: Mac and 4 Grade View: Grade III Tube type: Oral Tube size: 7.5 mm Number of attempts: 1 Airway Equipment and Method: Stylet Placement Confirmation: ETT inserted through vocal cords under direct vision,  positive ETCO2 and breath sounds checked- equal and bilateral Secured at: 24 cm Tube secured with: Tape Dental Injury: Teeth and Oropharynx as per pre-operative assessment
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
## 2019-01-31 NOTE — Op Note (Signed)
Intra-operative fluoroscopic images in the AP, lateral, and oblique views were taken and evaluated by myself.  Reduction and hardware placement were confirmed.  There was no intraarticular penetration of permanent hardware.  

## 2019-01-31 NOTE — Discharge Instructions (Signed)

## 2019-02-01 LAB — ETHANOL: Alcohol, Ethyl (B): 10 mg/dL — ABNORMAL HIGH (ref <10)

## 2019-02-01 MED ORDER — ONDANSETRON HCL 4 MG PO TABS: 4.0000 mg | ORAL_TABLET | Freq: Four times a day (QID) | ORAL | Status: DC | PRN

## 2019-02-01 MED ORDER — TEMAZEPAM 15 MG PO CAPS: 15.0000 mg | ORAL_CAPSULE | Freq: Every evening | ORAL | Status: DC | PRN

## 2019-02-01 MED ORDER — METHOCARBAMOL 1000 MG/10ML IJ SOLN: 500.0000 mg | Freq: Four times a day (QID) | INTRAVENOUS | Status: DC | PRN

## 2019-02-01 MED ORDER — DIPHENHYDRAMINE HCL 25 MG PO CAPS: 25.0000 mg | ORAL_CAPSULE | Freq: Four times a day (QID) | ORAL | Status: DC | PRN

## 2019-02-01 MED ORDER — HYDROCODONE-ACETAMINOPHEN 5-325 MG PO TABS: 1.0000 | ORAL_TABLET | ORAL | Status: DC | PRN

## 2019-02-01 MED ORDER — NAPROXEN 250 MG PO TABS
250.0000 mg | ORAL_TABLET | Freq: Two times a day (BID) | ORAL | Status: DC
  Administered 2019-02-01: 250 mg via ORAL
  Filled 2019-02-01: qty 1

## 2019-02-01 MED ORDER — MORPHINE SULFATE (PF) 2 MG/ML IV SOLN: 0.5000 mg | INTRAVENOUS | Status: DC | PRN

## 2019-02-01 MED ORDER — CEFAZOLIN SODIUM-DEXTROSE 1-4 GM/50ML-% IV SOLN
1.0000 g | Freq: Three times a day (TID) | INTRAVENOUS | Status: DC
  Administered 2019-02-01 (×2): 1 g via INTRAVENOUS
  Filled 2019-02-01 (×3): qty 50

## 2019-02-01 MED ORDER — CEFAZOLIN SODIUM-DEXTROSE 1-4 GM/50ML-% IV SOLN: 1.0000 g | INTRAVENOUS | Status: DC

## 2019-02-01 MED ORDER — LACTATED RINGERS IV SOLN: INTRAVENOUS | Status: DC

## 2019-02-01 MED ORDER — ASCORBIC ACID 500 MG PO TABS
1000.0000 mg | ORAL_TABLET | Freq: Every day | ORAL | Status: DC
  Administered 2019-02-01: 1000 mg via ORAL
  Filled 2019-02-01: qty 2

## 2019-02-01 MED ORDER — ACETAMINOPHEN 325 MG PO TABS: 325.0000 mg | ORAL_TABLET | Freq: Four times a day (QID) | ORAL | Status: DC | PRN

## 2019-02-01 MED ORDER — ONDANSETRON HCL 4 MG/2ML IJ SOLN: 4.0000 mg | Freq: Four times a day (QID) | INTRAMUSCULAR | Status: DC | PRN

## 2019-02-01 MED ORDER — METHOCARBAMOL 500 MG PO TABS: 500.0000 mg | ORAL_TABLET | Freq: Four times a day (QID) | ORAL | Status: DC | PRN

## 2019-02-01 NOTE — Progress Notes (Signed)
Discharge paperwork discussed with patient; Interpreter services used via [employer]. Patient verbalized understanding of discharge instructions.
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
## 2019-02-01 NOTE — Plan of Care (Signed)

## 2019-02-01 NOTE — Anesthesia Postprocedure Evaluation (Signed)
Anesthesia Post Note  Patient: Cole Ward  Procedure(s) Performed: LEFT SMALL FINGER IRRIGATION AND DEBRIEDMENT, REPAIR TENDON (Left Hand)     Patient location during evaluation: PACU Anesthesia Type: General Level of consciousness: awake and alert Pain management: pain level controlled Vital Signs Assessment: post-procedure vital signs reviewed and stable Respiratory status: spontaneous breathing, nonlabored ventilation, respiratory function stable and patient connected to nasal cannula oxygen Cardiovascular status: blood pressure returned to baseline and stable Postop Assessment: no apparent nausea or vomiting Anesthetic complications: no    Last Vitals:  Vitals:   01/31/19 2342 01/31/19 2358  BP: (!) 125/95 127/84  Pulse: 90 88  Resp: 19 16  Temp:  37.1 C  SpO2: 100% 100%    Last Pain:  Vitals:   01/31/19 1558  TempSrc: Oral                 Zerline Melchior,W. EDMOND

## 2019-02-02 ENCOUNTER — Encounter (HOSPITAL_COMMUNITY): Payer: Self-pay | Admitting: Obstetrics and Gynecology

## 2019-02-13 ENCOUNTER — Emergency Department (HOSPITAL_COMMUNITY)
Admission: EM | Admit: 2019-02-13 | Discharge: 2019-02-13 | Disposition: A | Discharge status: Home / Self Care | Payer: Medicaid Other | Attending: Emergency Medicine | Admitting: Emergency Medicine

## 2019-02-13 ENCOUNTER — Encounter (HOSPITAL_COMMUNITY): Payer: Self-pay

## 2019-02-13 ENCOUNTER — Other Ambulatory Visit: Payer: Self-pay

## 2019-02-13 DIAGNOSIS — Z4889 Encounter for other specified surgical aftercare: Secondary | ICD-10-CM

## 2019-02-13 DIAGNOSIS — Z4802 Encounter for removal of sutures: Secondary | ICD-10-CM | POA: Insufficient documentation

## 2019-02-13 DIAGNOSIS — F17210 Nicotine dependence, cigarettes, uncomplicated: Secondary | ICD-10-CM | POA: Insufficient documentation

## 2019-02-13 NOTE — ED Notes (Signed)
Interpretor [phone]  PA explained she will set pt up with an appointment for the outpatient surgeon to remove pin in his finger. Pt verbalizes he understands he will f/u w/surgeon at their office.

## 2019-02-13 NOTE — Discharge Instructions (Addendum)
You need to follow-up with Dr. Fredna Dow on Thursday 12/31 at 3:45 pm in his office the address is listed below.   [place], [place] [address]   Appointments  [phone]

## 2019-02-13 NOTE — ED Notes (Signed)
Interpretor [id number]  Pt explained to follow up with Dr. Fredna Dow in his office Thursday 12/31 at 3:45. Pt verbalizes he understands then points to his watch which is a day behind. RN fixed his watch to match today's date and day. Interpreter explained again to pt time and location. As pt was leaving he said he will go home and return later today. I explained again no he goes on Thursday. I called Dr. Levell July office to make them aware he may show up at their office anytime between now and this week.   Pt dc'd home w/all belongings, a/o x4

## 2019-02-13 NOTE — ED Provider Notes (Signed)
[facility] EMERGENCY DEPARTMENT Provider Note   CSN: [id number] Arrival date & time: 02/13/19  1008     History Chief Complaint  Patient presents with  . Suture / Staple Removal    Cole Ward is a 48 y.o. male with past medical history difficult for alcohol abuse, tobacco abuse who presents for evaluation of suture removal.  Patient with alcohol intoxication mechanical fall on 01/31/2019.  Patient had acute tendon rupture with laceration to his left pinky.  He went to the OR with Dr. Fredna Dow and had tendon repaired, pin place and sutures.  He is supposed to follow-up after 1 week however got lost to follow-up.  Patient denies fever, chills, nausea, vomiting, chest pain, shortness of breath abdominal pain, diarrhea, dysuria, paresthesias, redness, swelling, warmth, drainage or bleeding from his wound.  Patient states he is here to "get this out."  Denies additional aggravating or alleviating factors.  Rates his pain a 1.  History obtained from patient and past medical records.  Medical Burmese/Taiwanese interpreter.  HPI     Past Medical History:  Diagnosis Date  . Alcohol-induced pancreatitis   . Tobacco abuse   . Vision decreased     Patient Active Problem List   Diagnosis Date Noted  . Laceration of extensor muscle, fascia and tendon of left little finger at wrist and hand level, initial encounter 01/31/2019  . Acute pancreatitis 05/16/2018  . Pancreatic pseudocyst 05/16/2018  . Headache 05/16/2018  . Hyponatremia 05/16/2018  . Thrombocytopenia ([place]) 10/04/2017  . Acute alcoholic pancreatitis 99/24/2683  . Colitis 07/12/2014  . Acute esophagitis 07/12/2014  . Abdominal pain 07/06/2014  . Elevated liver enzymes 07/06/2014  . Transaminitis 07/06/2014  . GI bleed 07/06/2014  . Alcohol abuse 07/06/2014  . Tobacco abuse     Past Surgical History:  Procedure Laterality Date  . ESOPHAGOGASTRODUODENOSCOPY N/A 07/07/2014   Procedure:  ESOPHAGOGASTRODUODENOSCOPY (EGD);  Surgeon: Carol Ada, MD;  Location: [facility] END[facility]COPY;  Service: Endoscopy;  Laterality: N/A;  . I & D EXTREMITY Left 01/31/2019   Procedure: LEFT SMALL FINGER IRRIGATION AND [place], REPAIR TENDON;  Surgeon: Leanora Cover, MD;  Location: [facility];  Service: Orthopedics;  Laterality: Left;       History reviewed. No pertinent family history.  Social History   Tobacco Use  . Smoking status: Current Every Day Smoker    Packs/day: 0.25    Years: 35.00    Pack years: 8.75    Types: Cigarettes  . Smokeless tobacco: Never Used  Substance Use Topics  . Alcohol use: Yes    Alcohol/week: 3.0 standard drinks    Types: 3 Shots of liquor per week  . Drug use: No    Home Medications Prior to Admission medications   Medication Sig Start Date End Date Taking? Authorizing Provider  HYDROcodone-acetaminophen (NORCO) 5-325 MG tablet 1-2 tabs po q6 hours prn pain 01/31/19   Leanora Cover, MD  sulfamethoxazole-trimethoprim (BACTRIM DS) 800-160 MG tablet Take 1 tablet by mouth 2 (two) times daily. 01/31/19   Leanora Cover, MD    Allergies    Patient has no known allergies.  Review of Systems   Review of Systems  Constitutional: Negative.   HENT: Negative.   Respiratory: Negative.   Cardiovascular: Negative.   Gastrointestinal: Negative.   Genitourinary: Negative.   Musculoskeletal: Negative.   Skin: Positive for wound.  Neurological: Negative.   All other systems reviewed and are negative.   Physical Exam Updated Vital Signs BP (!) 135/94 (BP Location: Left  Arm)   Pulse (!) 110   Temp 98.1 F (36.7 C) (Oral)   Resp 18   SpO2 98%   Physical Exam Vitals and nursing note reviewed.  Constitutional:      General: He is not in acute distress.    Appearance: He is well-developed. He is not ill-appearing, toxic-appearing or diaphoretic.  HENT:     Head: Atraumatic.  Eyes:     Pupils: Pupils are equal, round, and reactive to light.  Cardiovascular:       Rate and Rhythm: Regular rhythm. Tachycardia present.     Pulses: Normal pulses.          Radial pulses are 2+ on the right side.     Heart sounds: Normal heart sounds.     Comments: HR to 98 Pulmonary:     Effort: Pulmonary effort is normal. No respiratory distress.     Breath sounds: Normal breath sounds and air entry.  Abdominal:     General: There is no distension.     Palpations: Abdomen is soft.  Musculoskeletal:     Right hand: Normal.     Left hand: Laceration present. No swelling, deformity, tenderness or bony tenderness. Decreased range of motion. Normal strength. Normal sensation. There is no disruption of two-point discrimination. Normal capillary refill. Normal pulse.       Hands:     Cervical back: Normal range of motion and neck supple.     Comments: Pin place to medial aspect of pinky PIP.  There is some mildly decreased range of motion with flexion.  Sutures placed.  No edema, erythema or warmth.  No bony tenderness to hands.  Skin:    General: Skin is warm and dry.     Capillary Refill: Capillary refill takes less than 2 seconds.     Comments: Old laceration with sutures placed over the PIP to left middle finger.  No drainage, bleeding.  No edema, erythema or warmth.  Brisk capillary refill.  No fluctuance, induration, cellulitis.  Pin visualized without any discharge.  Neurological:     Mental Status: He is alert.     Sensory: Sensation is intact.     Motor: Motor function is intact.     Coordination: Coordination is intact.     Gait: Gait is intact.     Comments: Intact sensation.       ED Results / Procedures / Treatments   Labs (all labs ordered are listed, but only abnormal results are displayed) Labs Reviewed - No data to display  EKG None  Radiology No results found.  Procedures Procedures (including critical care time)  Medications Ordered in ED Medications - No data to display  ED Course  I have reviewed the triage vital signs and  the nursing notes.  Pertinent labs & imaging results that were available during my care of the patient were reviewed by me and considered in my medical decision making (see chart for details).   48 year old male appears otherwise well presents for evaluation of suture removal.  He is afebrile, nonseptic, not ill-appearing.  Patient underwent PIP pin, tendon repair and repair of open fracture to his left pinky finger 2 weeks ago.  He was lost to follow-up due to translation issues.  Wound does not look actively infected.  He was initially mildly tachycardic when he arrived however my evaluation heart rate in 90s.  Denies any systemic symptoms.  Wound looks well-healing without any drainage or bleeding.  Does have some mild decreased  range of motion at his PIP joint due to pin placement.  No evidence of abscess or cellulitis.  He is tolerating p.o. intake at home.  Does have a history of alcohol abuse however he does not appear to be in withdrawal at this time. Does not want resources for alcohol abuse, no plans on cessation. Clinically sober with steady gait, no tremors, appropriate speech.  Consult with Earney HamburgMichael Jeffrey with hand surgery.  Recommends patient follows up outpatient with Dr. Merlyn LotKuzma.  I personally called Dr. Merrilee SeashoreKuzma's office and scheduled him an outpatient visit for Thursday 12/31 at 3:45.  No evidence of infectious process on exam. Wound does not look infected. No drainage bleeding from wound.  Gastric return precautions with patient.  Patient voiced understanding is agreeable for follow-up.  We will have him keep his appointment with Dr. Merlyn LotKuzma for wound recheck.  Given his wound does not look actively infected do not feel patient needs labs, imaging at this time.  I have low suspicion for septic joint, abscess, cellulitis, compartment syndrome, bacterial infectious process or vascular injury.  Patient states he does have a ride to this appointment.  The patient has been appropriately medically  screened and/or stabilized in the ED. I have low suspicion for any other emergent medical condition which would require further screening, evaluation or treatment in the ED or require inpatient management.  Patient is hemodynamically stable and in no acute distress.  Patient able to ambulate in department prior to ED.  Evaluation does not show acute pathology that would require ongoing or additional emergent interventions while in the emergency department or further inpatient treatment.  I have discussed the diagnosis with the patient and answered all questions.  Pain is been managed while in the emergency department and patient has no further complaints prior to discharge.  Patient is comfortable with plan discussed in room and is stable for discharge at this time.  I have discussed strict return precautions for returning to the emergency department.  Patient was encouraged to follow-up with PCP/specialist refer to at discharge.    MDM Rules/Calculators/A&P                       Final Clinical Impression(s) / ED Diagnoses Final diagnoses:  Visit for suture removal  Encounter for post surgical wound check    Rx / DC Orders ED Discharge Orders    None       Parisa Pinela A, PA-C 02/13/19 1100    Tegeler, Canary Brimhristopher J, MD 02/13/19 1659

## 2019-02-13 NOTE — ED Triage Notes (Addendum)
Interpretor [id number] Brittni PA at bedside  Pt requesting to have sutures removed from Left pinky finger. Pt denies following up with surgeon after surgery. Pt reports increase pain when its cold.  Pt presents w/a pin placed by an orthopedic surgeon.

## 2020-12-26 IMAGING — DX DG HAND 2V*L*
2 series · 2 of 2 positions shown · non-contrast
Comparison: 01/31/2019

CLINICAL DATA: Trauma, injury

EXAM:
LEFT HAND - 2 VIEW

[hand pa]
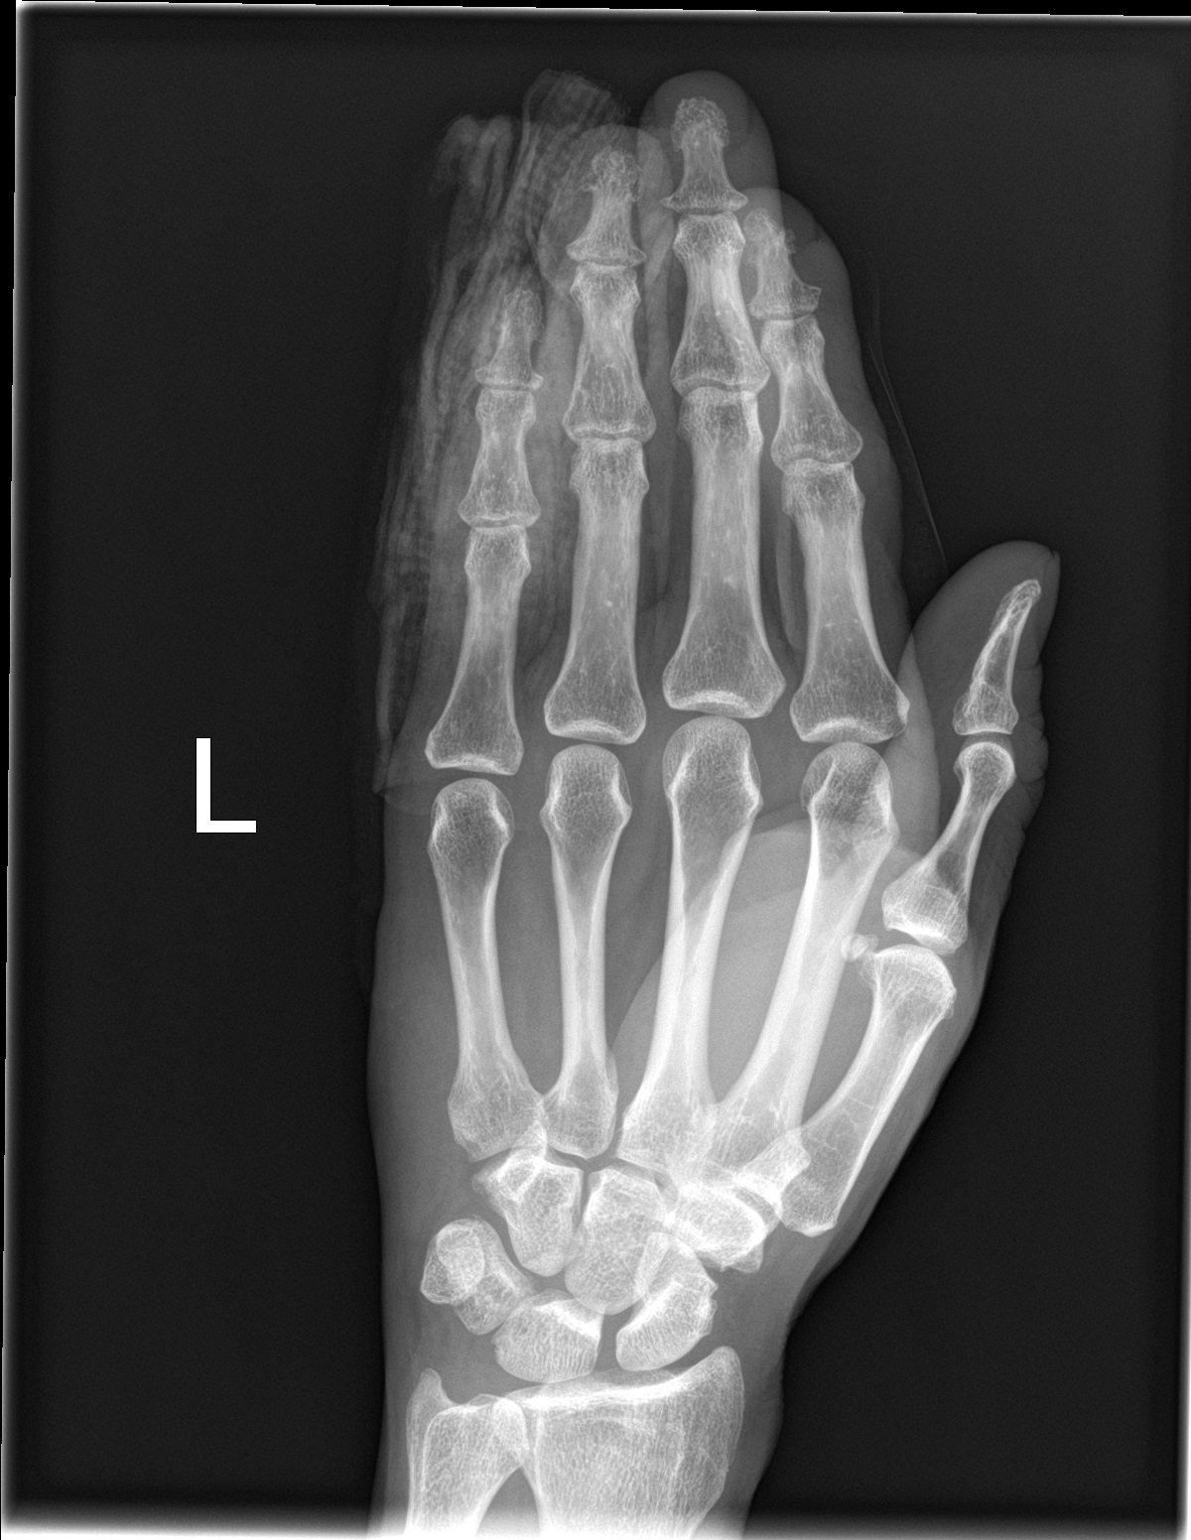

[hand lat]
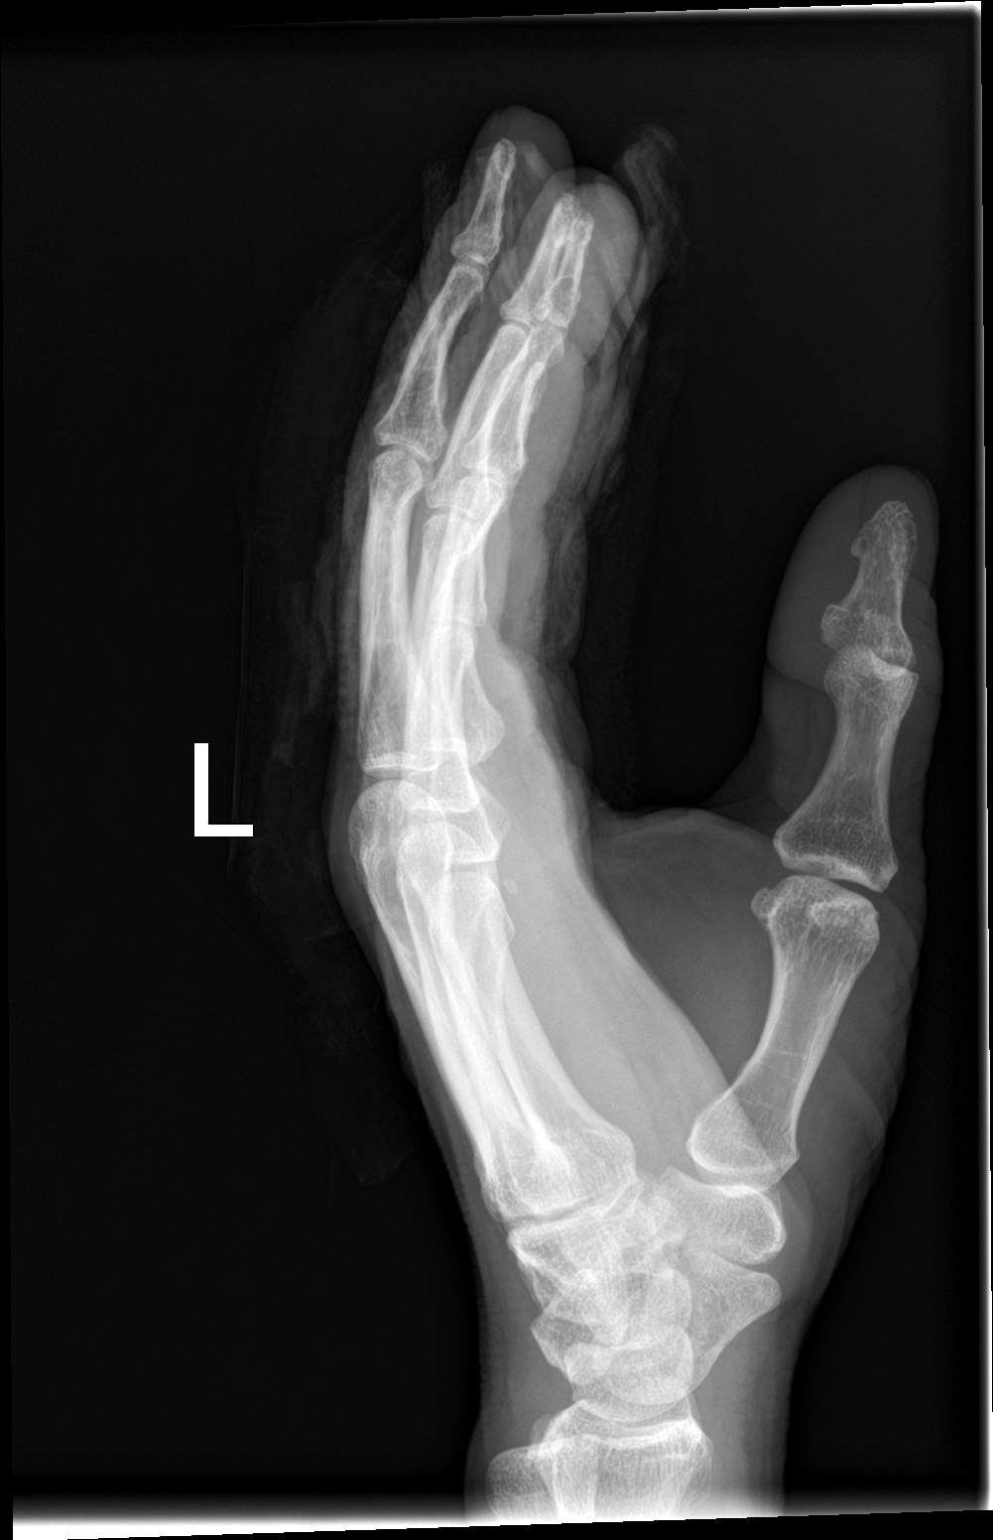

[2 of 2 positions shown; findings below may reference images not displayed]

FINDINGS: Artifact overlies the left hand third through fifth digits. No acute
osseous finding, displaced fracture malalignment. No subluxation or
dislocation. No suspicious radiopaque foreign body. Soft tissue
injuries suspected of the left fourth and fifth fingers.
IMPRESSION: Soft tissue injury.  No acute osseous finding.

## 2020-12-26 IMAGING — DX DG WRIST COMPLETE 3+V*L*
4 series · 4 of 4 positions shown · non-contrast
Comparison: 01/31/2019

CLINICAL DATA: Injury, pain

EXAM:
LEFT WRIST - COMPLETE 3+ VIEW

[wrist pa]
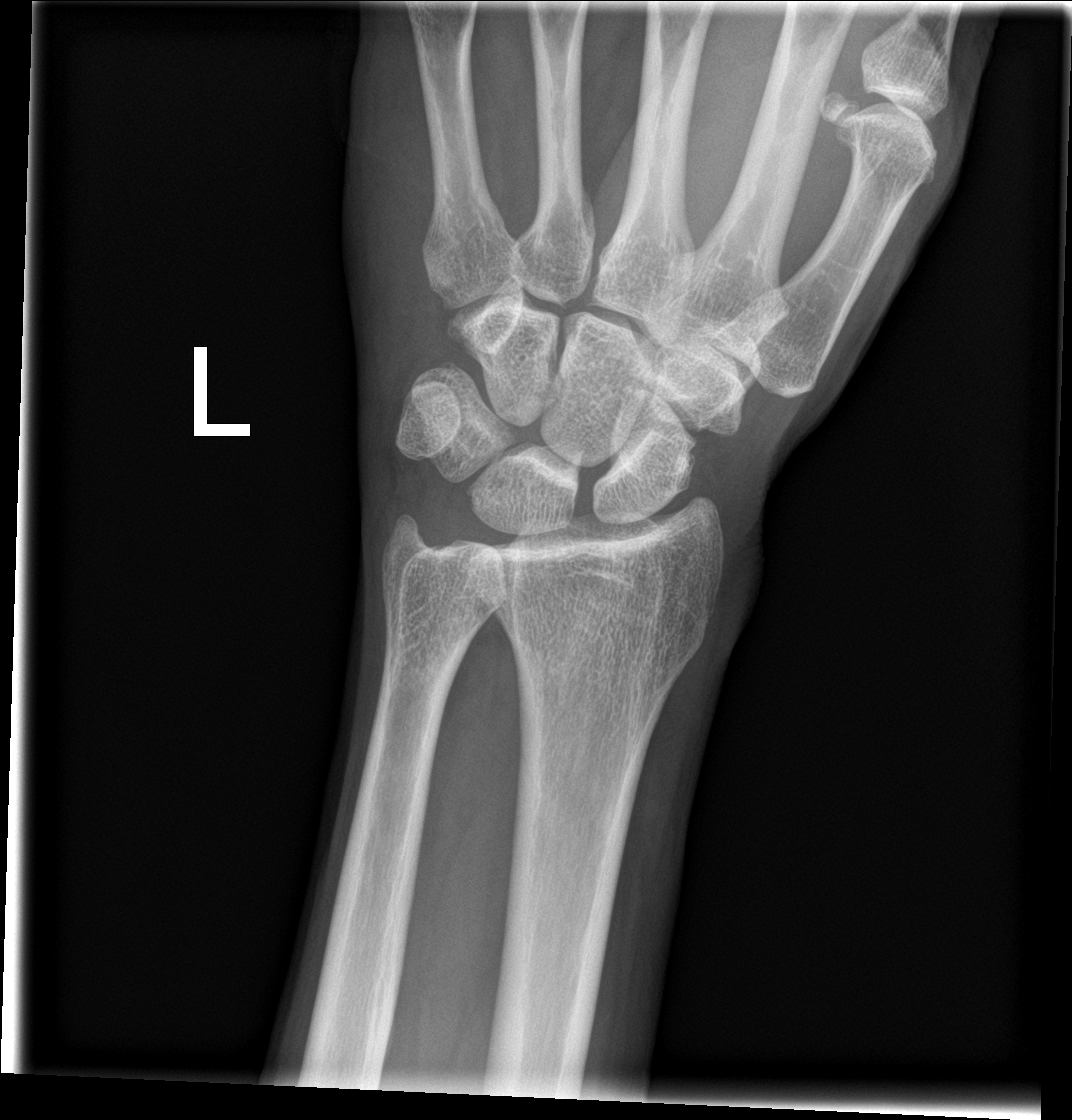

[wrist obl]
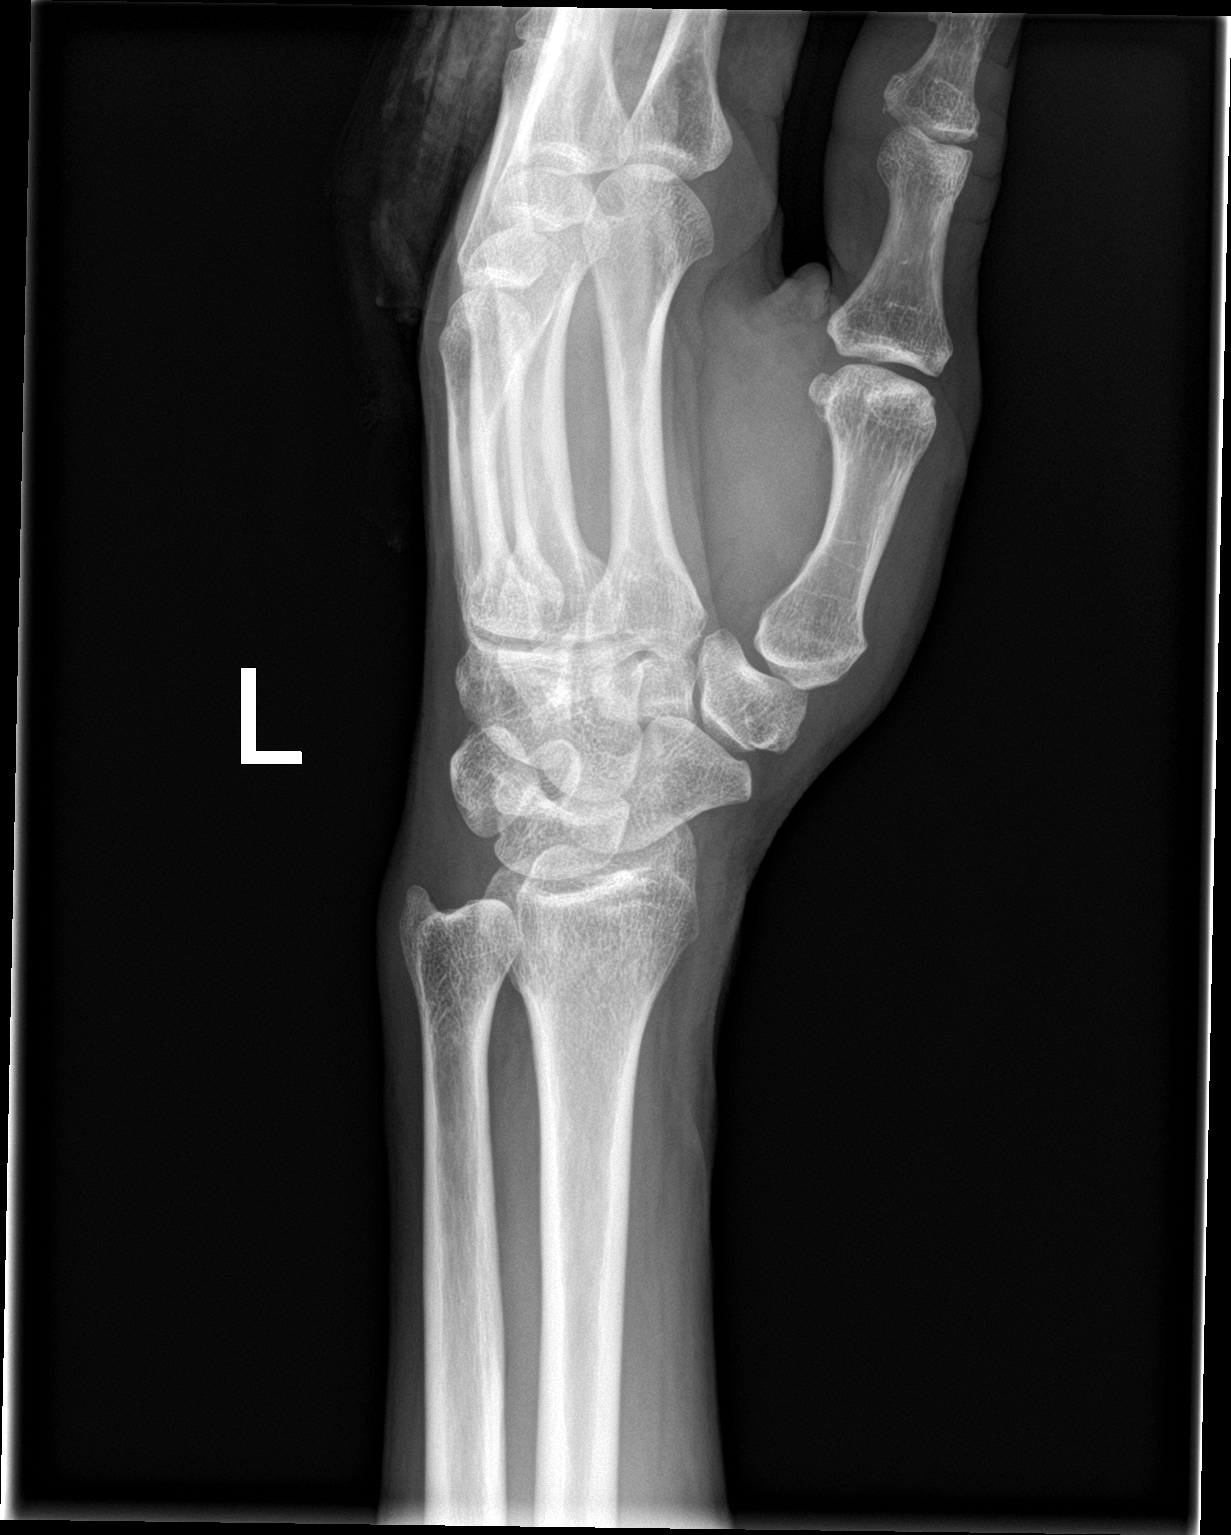

[wrist lat]
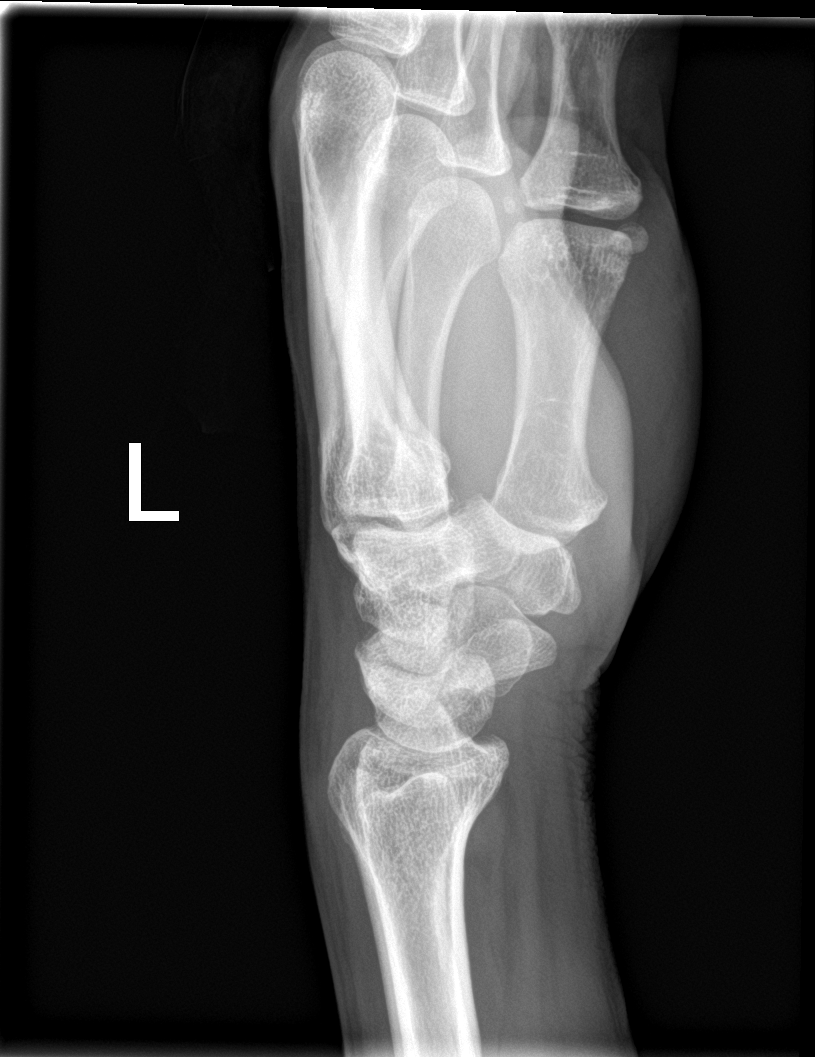

[wrist navicular]
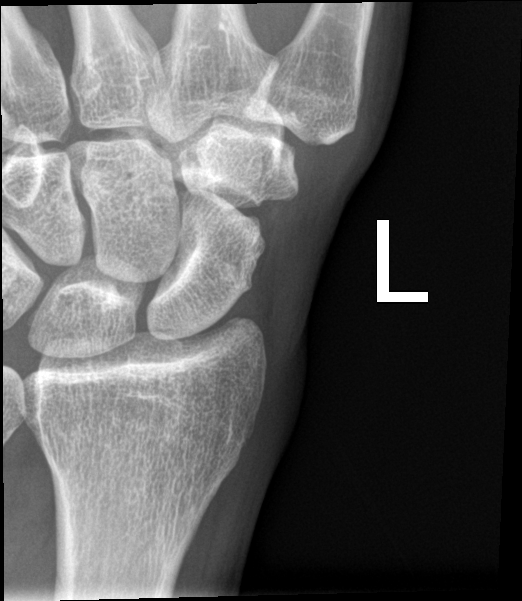

[4 of 4 positions shown; findings below may reference images not displayed]

FINDINGS: There is no evidence of fracture or dislocation. There is no
evidence of arthropathy or other focal bone abnormality. Soft
tissues are unremarkable.
IMPRESSION: No acute osseous finding
